# Patient Record
Sex: Male | Born: 1940
Health system: Southern US, Community
[De-identification: ages and names within clinical notes are randomized; demographics above are authoritative.]

## PROBLEM LIST (undated history)

## (undated) DIAGNOSIS — G8929 Other chronic pain: Secondary | ICD-10-CM

## (undated) DIAGNOSIS — I10 Essential (primary) hypertension: Secondary | ICD-10-CM

## (undated) DIAGNOSIS — F32A Depression, unspecified: Secondary | ICD-10-CM

## (undated) HISTORY — PX: ABDOMINAL SURGERY: SHX537

---

## 2011-05-18 DIAGNOSIS — M545 Low back pain: Secondary | ICD-10-CM | POA: Diagnosis not present

## 2011-10-15 DIAGNOSIS — M1710 Unilateral primary osteoarthritis, unspecified knee: Secondary | ICD-10-CM | POA: Diagnosis not present

## 2011-11-20 DIAGNOSIS — Z79899 Other long term (current) drug therapy: Secondary | ICD-10-CM | POA: Diagnosis not present

## 2011-11-20 DIAGNOSIS — Z23 Encounter for immunization: Secondary | ICD-10-CM | POA: Diagnosis not present

## 2012-05-20 DIAGNOSIS — M1310 Monoarthritis, not elsewhere classified, unspecified site: Secondary | ICD-10-CM | POA: Diagnosis not present

## 2012-05-20 DIAGNOSIS — R5383 Other fatigue: Secondary | ICD-10-CM | POA: Diagnosis not present

## 2012-05-20 DIAGNOSIS — R609 Edema, unspecified: Secondary | ICD-10-CM | POA: Diagnosis not present

## 2012-05-20 DIAGNOSIS — Z6829 Body mass index (BMI) 29.0-29.9, adult: Secondary | ICD-10-CM | POA: Diagnosis not present

## 2012-05-20 DIAGNOSIS — R509 Fever, unspecified: Secondary | ICD-10-CM | POA: Diagnosis not present

## 2012-05-20 DIAGNOSIS — R5381 Other malaise: Secondary | ICD-10-CM | POA: Diagnosis not present

## 2012-05-20 DIAGNOSIS — M79609 Pain in unspecified limb: Secondary | ICD-10-CM | POA: Diagnosis not present

## 2012-05-21 DIAGNOSIS — Z8589 Personal history of malignant neoplasm of other organs and systems: Secondary | ICD-10-CM | POA: Diagnosis not present

## 2012-05-21 DIAGNOSIS — M79609 Pain in unspecified limb: Secondary | ICD-10-CM | POA: Diagnosis not present

## 2012-05-21 DIAGNOSIS — R599 Enlarged lymph nodes, unspecified: Secondary | ICD-10-CM | POA: Diagnosis not present

## 2012-05-21 DIAGNOSIS — Z85828 Personal history of other malignant neoplasm of skin: Secondary | ICD-10-CM | POA: Diagnosis not present

## 2012-05-21 DIAGNOSIS — R609 Edema, unspecified: Secondary | ICD-10-CM | POA: Diagnosis not present

## 2012-05-21 DIAGNOSIS — M7989 Other specified soft tissue disorders: Secondary | ICD-10-CM | POA: Diagnosis not present

## 2012-06-22 DIAGNOSIS — R197 Diarrhea, unspecified: Secondary | ICD-10-CM | POA: Diagnosis not present

## 2012-06-22 DIAGNOSIS — Z5181 Encounter for therapeutic drug level monitoring: Secondary | ICD-10-CM | POA: Diagnosis not present

## 2012-06-23 DIAGNOSIS — R197 Diarrhea, unspecified: Secondary | ICD-10-CM | POA: Diagnosis not present

## 2012-06-27 DIAGNOSIS — F17200 Nicotine dependence, unspecified, uncomplicated: Secondary | ICD-10-CM | POA: Diagnosis not present

## 2012-06-27 DIAGNOSIS — K9413 Enterostomy malfunction: Secondary | ICD-10-CM | POA: Diagnosis not present

## 2012-06-27 DIAGNOSIS — K5732 Diverticulitis of large intestine without perforation or abscess without bleeding: Secondary | ICD-10-CM | POA: Diagnosis not present

## 2012-06-27 DIAGNOSIS — K9403 Colostomy malfunction: Secondary | ICD-10-CM | POA: Diagnosis not present

## 2012-07-22 DIAGNOSIS — Z85038 Personal history of other malignant neoplasm of large intestine: Secondary | ICD-10-CM | POA: Diagnosis not present

## 2012-07-26 DIAGNOSIS — Q619 Cystic kidney disease, unspecified: Secondary | ICD-10-CM | POA: Diagnosis not present

## 2012-07-26 DIAGNOSIS — Q762 Congenital spondylolisthesis: Secondary | ICD-10-CM | POA: Diagnosis not present

## 2012-07-26 DIAGNOSIS — M5137 Other intervertebral disc degeneration, lumbosacral region: Secondary | ICD-10-CM | POA: Diagnosis not present

## 2012-07-26 DIAGNOSIS — C187 Malignant neoplasm of sigmoid colon: Secondary | ICD-10-CM | POA: Diagnosis not present

## 2012-08-29 DIAGNOSIS — F17200 Nicotine dependence, unspecified, uncomplicated: Secondary | ICD-10-CM | POA: Diagnosis not present

## 2012-08-29 DIAGNOSIS — Z933 Colostomy status: Secondary | ICD-10-CM | POA: Diagnosis not present

## 2012-08-29 DIAGNOSIS — Z1211 Encounter for screening for malignant neoplasm of colon: Secondary | ICD-10-CM | POA: Diagnosis not present

## 2012-08-29 DIAGNOSIS — Z9049 Acquired absence of other specified parts of digestive tract: Secondary | ICD-10-CM | POA: Diagnosis not present

## 2012-08-29 DIAGNOSIS — Z85038 Personal history of other malignant neoplasm of large intestine: Secondary | ICD-10-CM | POA: Diagnosis not present

## 2012-08-30 DIAGNOSIS — K9413 Enterostomy malfunction: Secondary | ICD-10-CM | POA: Diagnosis not present

## 2012-08-30 DIAGNOSIS — F17200 Nicotine dependence, unspecified, uncomplicated: Secondary | ICD-10-CM | POA: Diagnosis not present

## 2012-08-30 DIAGNOSIS — K5732 Diverticulitis of large intestine without perforation or abscess without bleeding: Secondary | ICD-10-CM | POA: Diagnosis not present

## 2012-08-30 DIAGNOSIS — K9403 Colostomy malfunction: Secondary | ICD-10-CM | POA: Diagnosis not present

## 2012-09-05 DIAGNOSIS — Z0389 Encounter for observation for other suspected diseases and conditions ruled out: Secondary | ICD-10-CM | POA: Diagnosis not present

## 2012-09-12 DIAGNOSIS — K380 Hyperplasia of appendix: Secondary | ICD-10-CM | POA: Diagnosis not present

## 2012-09-12 DIAGNOSIS — R110 Nausea: Secondary | ICD-10-CM | POA: Diagnosis not present

## 2012-09-12 DIAGNOSIS — K9413 Enterostomy malfunction: Secondary | ICD-10-CM | POA: Diagnosis present

## 2012-09-12 DIAGNOSIS — K5730 Diverticulosis of large intestine without perforation or abscess without bleeding: Secondary | ICD-10-CM | POA: Diagnosis present

## 2012-09-12 DIAGNOSIS — K660 Peritoneal adhesions (postprocedural) (postinfection): Secondary | ICD-10-CM | POA: Diagnosis not present

## 2012-09-12 DIAGNOSIS — F17200 Nicotine dependence, unspecified, uncomplicated: Secondary | ICD-10-CM | POA: Diagnosis not present

## 2012-09-12 DIAGNOSIS — Z433 Encounter for attention to colostomy: Secondary | ICD-10-CM | POA: Diagnosis not present

## 2012-09-12 DIAGNOSIS — K639 Disease of intestine, unspecified: Secondary | ICD-10-CM | POA: Diagnosis present

## 2012-09-12 DIAGNOSIS — K9403 Colostomy malfunction: Secondary | ICD-10-CM | POA: Diagnosis not present

## 2012-09-12 DIAGNOSIS — K630 Abscess of intestine: Secondary | ICD-10-CM | POA: Diagnosis not present

## 2012-09-12 DIAGNOSIS — K37 Unspecified appendicitis: Secondary | ICD-10-CM | POA: Diagnosis not present

## 2012-09-12 DIAGNOSIS — Z85038 Personal history of other malignant neoplasm of large intestine: Secondary | ICD-10-CM | POA: Diagnosis not present

## 2012-11-25 DIAGNOSIS — Z23 Encounter for immunization: Secondary | ICD-10-CM | POA: Diagnosis not present

## 2012-11-25 DIAGNOSIS — Z5181 Encounter for therapeutic drug level monitoring: Secondary | ICD-10-CM | POA: Diagnosis not present

## 2012-12-23 DIAGNOSIS — M21169 Varus deformity, not elsewhere classified, unspecified knee: Secondary | ICD-10-CM | POA: Diagnosis not present

## 2012-12-23 DIAGNOSIS — M1710 Unilateral primary osteoarthritis, unspecified knee: Secondary | ICD-10-CM | POA: Diagnosis not present

## 2012-12-23 DIAGNOSIS — R269 Unspecified abnormalities of gait and mobility: Secondary | ICD-10-CM | POA: Diagnosis not present

## 2012-12-23 DIAGNOSIS — M25569 Pain in unspecified knee: Secondary | ICD-10-CM | POA: Diagnosis not present

## 2013-01-20 DIAGNOSIS — M1710 Unilateral primary osteoarthritis, unspecified knee: Secondary | ICD-10-CM | POA: Diagnosis not present

## 2013-01-23 DIAGNOSIS — M25569 Pain in unspecified knee: Secondary | ICD-10-CM | POA: Diagnosis not present

## 2013-01-27 DIAGNOSIS — M1710 Unilateral primary osteoarthritis, unspecified knee: Secondary | ICD-10-CM | POA: Diagnosis not present

## 2013-02-03 DIAGNOSIS — M1710 Unilateral primary osteoarthritis, unspecified knee: Secondary | ICD-10-CM | POA: Diagnosis not present

## 2013-02-14 DIAGNOSIS — J019 Acute sinusitis, unspecified: Secondary | ICD-10-CM | POA: Diagnosis not present

## 2013-02-20 DIAGNOSIS — M7989 Other specified soft tissue disorders: Secondary | ICD-10-CM | POA: Diagnosis not present

## 2013-02-20 DIAGNOSIS — M25529 Pain in unspecified elbow: Secondary | ICD-10-CM | POA: Diagnosis not present

## 2013-02-20 DIAGNOSIS — M948X9 Other specified disorders of cartilage, unspecified sites: Secondary | ICD-10-CM | POA: Diagnosis not present

## 2013-02-20 DIAGNOSIS — S3981XA Other specified injuries of abdomen, initial encounter: Secondary | ICD-10-CM | POA: Diagnosis not present

## 2013-02-20 DIAGNOSIS — S52599A Other fractures of lower end of unspecified radius, initial encounter for closed fracture: Secondary | ICD-10-CM | POA: Diagnosis not present

## 2013-02-20 DIAGNOSIS — M109 Gout, unspecified: Secondary | ICD-10-CM | POA: Diagnosis not present

## 2013-02-20 DIAGNOSIS — F17200 Nicotine dependence, unspecified, uncomplicated: Secondary | ICD-10-CM | POA: Diagnosis not present

## 2013-02-20 DIAGNOSIS — M1120 Other chondrocalcinosis, unspecified site: Secondary | ICD-10-CM | POA: Diagnosis not present

## 2013-02-20 DIAGNOSIS — S6990XA Unspecified injury of unspecified wrist, hand and finger(s), initial encounter: Secondary | ICD-10-CM | POA: Diagnosis not present

## 2013-02-20 DIAGNOSIS — S5290XA Unspecified fracture of unspecified forearm, initial encounter for closed fracture: Secondary | ICD-10-CM | POA: Diagnosis not present

## 2013-02-20 DIAGNOSIS — M129 Arthropathy, unspecified: Secondary | ICD-10-CM | POA: Diagnosis not present

## 2013-02-20 DIAGNOSIS — S59909A Unspecified injury of unspecified elbow, initial encounter: Secondary | ICD-10-CM | POA: Diagnosis not present

## 2013-02-20 DIAGNOSIS — R109 Unspecified abdominal pain: Secondary | ICD-10-CM | POA: Diagnosis not present

## 2013-02-20 DIAGNOSIS — M25539 Pain in unspecified wrist: Secondary | ICD-10-CM | POA: Diagnosis not present

## 2013-02-20 DIAGNOSIS — S8000XA Contusion of unspecified knee, initial encounter: Secondary | ICD-10-CM | POA: Diagnosis not present

## 2013-02-23 DIAGNOSIS — S52599A Other fractures of lower end of unspecified radius, initial encounter for closed fracture: Secondary | ICD-10-CM | POA: Diagnosis not present

## 2013-03-02 DIAGNOSIS — S52599A Other fractures of lower end of unspecified radius, initial encounter for closed fracture: Secondary | ICD-10-CM | POA: Diagnosis not present

## 2013-03-17 DIAGNOSIS — M1710 Unilateral primary osteoarthritis, unspecified knee: Secondary | ICD-10-CM | POA: Diagnosis not present

## 2013-03-17 DIAGNOSIS — M25569 Pain in unspecified knee: Secondary | ICD-10-CM | POA: Diagnosis not present

## 2013-03-23 DIAGNOSIS — S52599A Other fractures of lower end of unspecified radius, initial encounter for closed fracture: Secondary | ICD-10-CM | POA: Diagnosis not present

## 2013-03-24 DIAGNOSIS — M1710 Unilateral primary osteoarthritis, unspecified knee: Secondary | ICD-10-CM | POA: Diagnosis not present

## 2013-03-31 DIAGNOSIS — M1710 Unilateral primary osteoarthritis, unspecified knee: Secondary | ICD-10-CM | POA: Diagnosis not present

## 2013-04-07 DIAGNOSIS — M1710 Unilateral primary osteoarthritis, unspecified knee: Secondary | ICD-10-CM | POA: Diagnosis not present

## 2013-04-10 DIAGNOSIS — M1710 Unilateral primary osteoarthritis, unspecified knee: Secondary | ICD-10-CM | POA: Diagnosis not present

## 2013-04-10 DIAGNOSIS — R269 Unspecified abnormalities of gait and mobility: Secondary | ICD-10-CM | POA: Diagnosis not present

## 2013-04-12 DIAGNOSIS — R269 Unspecified abnormalities of gait and mobility: Secondary | ICD-10-CM | POA: Diagnosis not present

## 2013-04-12 DIAGNOSIS — M1710 Unilateral primary osteoarthritis, unspecified knee: Secondary | ICD-10-CM | POA: Diagnosis not present

## 2013-04-14 DIAGNOSIS — R269 Unspecified abnormalities of gait and mobility: Secondary | ICD-10-CM | POA: Diagnosis not present

## 2013-04-14 DIAGNOSIS — M1710 Unilateral primary osteoarthritis, unspecified knee: Secondary | ICD-10-CM | POA: Diagnosis not present

## 2013-04-18 DIAGNOSIS — R269 Unspecified abnormalities of gait and mobility: Secondary | ICD-10-CM | POA: Diagnosis not present

## 2013-04-18 DIAGNOSIS — M1710 Unilateral primary osteoarthritis, unspecified knee: Secondary | ICD-10-CM | POA: Diagnosis not present

## 2013-04-19 DIAGNOSIS — R269 Unspecified abnormalities of gait and mobility: Secondary | ICD-10-CM | POA: Diagnosis not present

## 2013-04-19 DIAGNOSIS — M1710 Unilateral primary osteoarthritis, unspecified knee: Secondary | ICD-10-CM | POA: Diagnosis not present

## 2013-04-20 DIAGNOSIS — S52599A Other fractures of lower end of unspecified radius, initial encounter for closed fracture: Secondary | ICD-10-CM | POA: Diagnosis not present

## 2013-04-21 DIAGNOSIS — M1710 Unilateral primary osteoarthritis, unspecified knee: Secondary | ICD-10-CM | POA: Diagnosis not present

## 2013-04-21 DIAGNOSIS — R269 Unspecified abnormalities of gait and mobility: Secondary | ICD-10-CM | POA: Diagnosis not present

## 2013-04-24 DIAGNOSIS — R269 Unspecified abnormalities of gait and mobility: Secondary | ICD-10-CM | POA: Diagnosis not present

## 2013-04-24 DIAGNOSIS — M1710 Unilateral primary osteoarthritis, unspecified knee: Secondary | ICD-10-CM | POA: Diagnosis not present

## 2013-04-25 DIAGNOSIS — F3289 Other specified depressive episodes: Secondary | ICD-10-CM | POA: Diagnosis not present

## 2013-04-25 DIAGNOSIS — F329 Major depressive disorder, single episode, unspecified: Secondary | ICD-10-CM | POA: Diagnosis not present

## 2013-04-26 DIAGNOSIS — R269 Unspecified abnormalities of gait and mobility: Secondary | ICD-10-CM | POA: Diagnosis not present

## 2013-04-26 DIAGNOSIS — M1710 Unilateral primary osteoarthritis, unspecified knee: Secondary | ICD-10-CM | POA: Diagnosis not present

## 2013-04-28 DIAGNOSIS — M1710 Unilateral primary osteoarthritis, unspecified knee: Secondary | ICD-10-CM | POA: Diagnosis not present

## 2013-04-28 DIAGNOSIS — R269 Unspecified abnormalities of gait and mobility: Secondary | ICD-10-CM | POA: Diagnosis not present

## 2013-05-02 DIAGNOSIS — R269 Unspecified abnormalities of gait and mobility: Secondary | ICD-10-CM | POA: Diagnosis not present

## 2013-05-02 DIAGNOSIS — M1710 Unilateral primary osteoarthritis, unspecified knee: Secondary | ICD-10-CM | POA: Diagnosis not present

## 2013-05-03 DIAGNOSIS — M1710 Unilateral primary osteoarthritis, unspecified knee: Secondary | ICD-10-CM | POA: Diagnosis not present

## 2013-05-03 DIAGNOSIS — R269 Unspecified abnormalities of gait and mobility: Secondary | ICD-10-CM | POA: Diagnosis not present

## 2013-05-04 DIAGNOSIS — S52599A Other fractures of lower end of unspecified radius, initial encounter for closed fracture: Secondary | ICD-10-CM | POA: Diagnosis not present

## 2013-05-05 DIAGNOSIS — R269 Unspecified abnormalities of gait and mobility: Secondary | ICD-10-CM | POA: Diagnosis not present

## 2013-05-05 DIAGNOSIS — M1710 Unilateral primary osteoarthritis, unspecified knee: Secondary | ICD-10-CM | POA: Diagnosis not present

## 2013-05-10 DIAGNOSIS — R269 Unspecified abnormalities of gait and mobility: Secondary | ICD-10-CM | POA: Diagnosis not present

## 2013-05-10 DIAGNOSIS — M1710 Unilateral primary osteoarthritis, unspecified knee: Secondary | ICD-10-CM | POA: Diagnosis not present

## 2013-05-17 DIAGNOSIS — R269 Unspecified abnormalities of gait and mobility: Secondary | ICD-10-CM | POA: Diagnosis not present

## 2013-05-17 DIAGNOSIS — M1710 Unilateral primary osteoarthritis, unspecified knee: Secondary | ICD-10-CM | POA: Diagnosis not present

## 2013-05-23 DIAGNOSIS — Z79899 Other long term (current) drug therapy: Secondary | ICD-10-CM | POA: Diagnosis not present

## 2013-05-23 DIAGNOSIS — F329 Major depressive disorder, single episode, unspecified: Secondary | ICD-10-CM | POA: Diagnosis not present

## 2013-05-23 DIAGNOSIS — F3289 Other specified depressive episodes: Secondary | ICD-10-CM | POA: Diagnosis not present

## 2013-05-24 DIAGNOSIS — M1710 Unilateral primary osteoarthritis, unspecified knee: Secondary | ICD-10-CM | POA: Diagnosis not present

## 2013-05-24 DIAGNOSIS — R269 Unspecified abnormalities of gait and mobility: Secondary | ICD-10-CM | POA: Diagnosis not present

## 2013-05-26 DIAGNOSIS — M1710 Unilateral primary osteoarthritis, unspecified knee: Secondary | ICD-10-CM | POA: Diagnosis not present

## 2013-05-26 DIAGNOSIS — R269 Unspecified abnormalities of gait and mobility: Secondary | ICD-10-CM | POA: Diagnosis not present

## 2013-05-30 DIAGNOSIS — M1710 Unilateral primary osteoarthritis, unspecified knee: Secondary | ICD-10-CM | POA: Diagnosis not present

## 2013-05-30 DIAGNOSIS — R269 Unspecified abnormalities of gait and mobility: Secondary | ICD-10-CM | POA: Diagnosis not present

## 2013-05-31 DIAGNOSIS — M1710 Unilateral primary osteoarthritis, unspecified knee: Secondary | ICD-10-CM | POA: Diagnosis not present

## 2013-05-31 DIAGNOSIS — R269 Unspecified abnormalities of gait and mobility: Secondary | ICD-10-CM | POA: Diagnosis not present

## 2013-06-01 DIAGNOSIS — S5290XA Unspecified fracture of unspecified forearm, initial encounter for closed fracture: Secondary | ICD-10-CM | POA: Diagnosis not present

## 2013-06-01 DIAGNOSIS — S52599A Other fractures of lower end of unspecified radius, initial encounter for closed fracture: Secondary | ICD-10-CM | POA: Diagnosis not present

## 2013-06-07 DIAGNOSIS — M1710 Unilateral primary osteoarthritis, unspecified knee: Secondary | ICD-10-CM | POA: Diagnosis not present

## 2013-06-07 DIAGNOSIS — R269 Unspecified abnormalities of gait and mobility: Secondary | ICD-10-CM | POA: Diagnosis not present

## 2013-06-08 DIAGNOSIS — R269 Unspecified abnormalities of gait and mobility: Secondary | ICD-10-CM | POA: Diagnosis not present

## 2013-06-08 DIAGNOSIS — M1710 Unilateral primary osteoarthritis, unspecified knee: Secondary | ICD-10-CM | POA: Diagnosis not present

## 2013-10-02 DIAGNOSIS — H2510 Age-related nuclear cataract, unspecified eye: Secondary | ICD-10-CM | POA: Diagnosis not present

## 2013-11-14 DIAGNOSIS — H179 Unspecified corneal scar and opacity: Secondary | ICD-10-CM | POA: Diagnosis not present

## 2013-11-14 DIAGNOSIS — H2510 Age-related nuclear cataract, unspecified eye: Secondary | ICD-10-CM | POA: Diagnosis not present

## 2013-11-22 DIAGNOSIS — M1990 Unspecified osteoarthritis, unspecified site: Secondary | ICD-10-CM | POA: Diagnosis not present

## 2013-11-22 DIAGNOSIS — F329 Major depressive disorder, single episode, unspecified: Secondary | ICD-10-CM | POA: Diagnosis not present

## 2013-11-22 DIAGNOSIS — Z9181 History of falling: Secondary | ICD-10-CM | POA: Diagnosis not present

## 2013-11-22 DIAGNOSIS — Z79899 Other long term (current) drug therapy: Secondary | ICD-10-CM | POA: Diagnosis not present

## 2013-11-22 DIAGNOSIS — Z23 Encounter for immunization: Secondary | ICD-10-CM | POA: Diagnosis not present

## 2013-12-18 DIAGNOSIS — E781 Pure hyperglyceridemia: Secondary | ICD-10-CM | POA: Diagnosis not present

## 2013-12-18 DIAGNOSIS — R351 Nocturia: Secondary | ICD-10-CM | POA: Diagnosis not present

## 2013-12-18 DIAGNOSIS — F5221 Male erectile disorder: Secondary | ICD-10-CM | POA: Diagnosis not present

## 2013-12-18 DIAGNOSIS — M179 Osteoarthritis of knee, unspecified: Secondary | ICD-10-CM | POA: Diagnosis not present

## 2013-12-20 DIAGNOSIS — M1711 Unilateral primary osteoarthritis, right knee: Secondary | ICD-10-CM | POA: Diagnosis not present

## 2013-12-20 DIAGNOSIS — M25561 Pain in right knee: Secondary | ICD-10-CM | POA: Diagnosis not present

## 2013-12-25 DIAGNOSIS — H2511 Age-related nuclear cataract, right eye: Secondary | ICD-10-CM | POA: Diagnosis not present

## 2013-12-25 DIAGNOSIS — I10 Essential (primary) hypertension: Secondary | ICD-10-CM | POA: Diagnosis not present

## 2013-12-25 DIAGNOSIS — H2513 Age-related nuclear cataract, bilateral: Secondary | ICD-10-CM | POA: Diagnosis not present

## 2013-12-27 DIAGNOSIS — M1711 Unilateral primary osteoarthritis, right knee: Secondary | ICD-10-CM | POA: Diagnosis not present

## 2013-12-29 DIAGNOSIS — M1990 Unspecified osteoarthritis, unspecified site: Secondary | ICD-10-CM | POA: Diagnosis not present

## 2013-12-29 DIAGNOSIS — Z01818 Encounter for other preprocedural examination: Secondary | ICD-10-CM | POA: Diagnosis not present

## 2013-12-29 DIAGNOSIS — I10 Essential (primary) hypertension: Secondary | ICD-10-CM | POA: Diagnosis not present

## 2014-01-02 DIAGNOSIS — H2513 Age-related nuclear cataract, bilateral: Secondary | ICD-10-CM | POA: Diagnosis not present

## 2014-01-08 DIAGNOSIS — H2512 Age-related nuclear cataract, left eye: Secondary | ICD-10-CM | POA: Diagnosis not present

## 2014-01-08 DIAGNOSIS — M1990 Unspecified osteoarthritis, unspecified site: Secondary | ICD-10-CM | POA: Diagnosis not present

## 2014-01-08 DIAGNOSIS — Z9049 Acquired absence of other specified parts of digestive tract: Secondary | ICD-10-CM | POA: Diagnosis not present

## 2014-01-08 DIAGNOSIS — H2513 Age-related nuclear cataract, bilateral: Secondary | ICD-10-CM | POA: Diagnosis not present

## 2014-01-08 DIAGNOSIS — M109 Gout, unspecified: Secondary | ICD-10-CM | POA: Diagnosis not present

## 2014-01-08 DIAGNOSIS — I10 Essential (primary) hypertension: Secondary | ICD-10-CM | POA: Diagnosis not present

## 2014-01-08 DIAGNOSIS — Z79899 Other long term (current) drug therapy: Secondary | ICD-10-CM | POA: Diagnosis not present

## 2014-01-09 DIAGNOSIS — I10 Essential (primary) hypertension: Secondary | ICD-10-CM | POA: Diagnosis not present

## 2014-01-09 DIAGNOSIS — Z01810 Encounter for preprocedural cardiovascular examination: Secondary | ICD-10-CM | POA: Diagnosis not present

## 2014-01-09 DIAGNOSIS — I252 Old myocardial infarction: Secondary | ICD-10-CM | POA: Diagnosis not present

## 2014-01-19 DIAGNOSIS — H2513 Age-related nuclear cataract, bilateral: Secondary | ICD-10-CM | POA: Diagnosis not present

## 2014-02-08 DIAGNOSIS — R109 Unspecified abdominal pain: Secondary | ICD-10-CM | POA: Diagnosis not present

## 2014-02-08 DIAGNOSIS — I10 Essential (primary) hypertension: Secondary | ICD-10-CM | POA: Diagnosis not present

## 2014-02-08 DIAGNOSIS — F329 Major depressive disorder, single episode, unspecified: Secondary | ICD-10-CM | POA: Diagnosis not present

## 2014-02-22 DIAGNOSIS — R109 Unspecified abdominal pain: Secondary | ICD-10-CM | POA: Diagnosis not present

## 2014-02-22 DIAGNOSIS — M1990 Unspecified osteoarthritis, unspecified site: Secondary | ICD-10-CM | POA: Diagnosis not present

## 2014-02-22 DIAGNOSIS — K439 Ventral hernia without obstruction or gangrene: Secondary | ICD-10-CM | POA: Diagnosis not present

## 2014-02-22 DIAGNOSIS — I10 Essential (primary) hypertension: Secondary | ICD-10-CM | POA: Diagnosis not present

## 2014-03-07 DIAGNOSIS — Z9911 Dependence on respirator [ventilator] status: Secondary | ICD-10-CM | POA: Diagnosis not present

## 2014-03-07 DIAGNOSIS — K430 Incisional hernia with obstruction, without gangrene: Secondary | ICD-10-CM | POA: Diagnosis not present

## 2014-03-07 DIAGNOSIS — J9811 Atelectasis: Secondary | ICD-10-CM | POA: Diagnosis not present

## 2014-03-07 DIAGNOSIS — R109 Unspecified abdominal pain: Secondary | ICD-10-CM | POA: Diagnosis not present

## 2014-03-07 DIAGNOSIS — E119 Type 2 diabetes mellitus without complications: Secondary | ICD-10-CM | POA: Diagnosis present

## 2014-03-07 DIAGNOSIS — K660 Peritoneal adhesions (postprocedural) (postinfection): Secondary | ICD-10-CM | POA: Diagnosis not present

## 2014-03-07 DIAGNOSIS — K913 Postprocedural intestinal obstruction: Secondary | ICD-10-CM | POA: Diagnosis present

## 2014-03-07 DIAGNOSIS — I471 Supraventricular tachycardia: Secondary | ICD-10-CM | POA: Diagnosis not present

## 2014-03-07 DIAGNOSIS — D649 Anemia, unspecified: Secondary | ICD-10-CM | POA: Diagnosis present

## 2014-03-07 DIAGNOSIS — R918 Other nonspecific abnormal finding of lung field: Secondary | ICD-10-CM | POA: Diagnosis not present

## 2014-03-07 DIAGNOSIS — Z85038 Personal history of other malignant neoplasm of large intestine: Secondary | ICD-10-CM | POA: Diagnosis not present

## 2014-03-07 DIAGNOSIS — F17210 Nicotine dependence, cigarettes, uncomplicated: Secondary | ICD-10-CM | POA: Diagnosis present

## 2014-03-07 DIAGNOSIS — Z716 Tobacco abuse counseling: Secondary | ICD-10-CM | POA: Diagnosis present

## 2014-03-07 DIAGNOSIS — I10 Essential (primary) hypertension: Secondary | ICD-10-CM | POA: Diagnosis present

## 2014-03-07 DIAGNOSIS — R000 Tachycardia, unspecified: Secondary | ICD-10-CM | POA: Diagnosis present

## 2014-03-07 DIAGNOSIS — Z9089 Acquired absence of other organs: Secondary | ICD-10-CM | POA: Diagnosis present

## 2014-03-07 DIAGNOSIS — L905 Scar conditions and fibrosis of skin: Secondary | ICD-10-CM | POA: Diagnosis not present

## 2014-03-07 DIAGNOSIS — M1990 Unspecified osteoarthritis, unspecified site: Secondary | ICD-10-CM | POA: Diagnosis present

## 2014-03-07 DIAGNOSIS — R0600 Dyspnea, unspecified: Secondary | ICD-10-CM | POA: Diagnosis not present

## 2014-03-07 DIAGNOSIS — J180 Bronchopneumonia, unspecified organism: Secondary | ICD-10-CM | POA: Diagnosis present

## 2014-03-07 DIAGNOSIS — K219 Gastro-esophageal reflux disease without esophagitis: Secondary | ICD-10-CM | POA: Diagnosis present

## 2014-03-07 DIAGNOSIS — R0902 Hypoxemia: Secondary | ICD-10-CM | POA: Diagnosis not present

## 2014-03-07 DIAGNOSIS — J9690 Respiratory failure, unspecified, unspecified whether with hypoxia or hypercapnia: Secondary | ICD-10-CM | POA: Diagnosis not present

## 2014-03-19 DIAGNOSIS — R0902 Hypoxemia: Secondary | ICD-10-CM | POA: Diagnosis not present

## 2014-03-19 DIAGNOSIS — Z6833 Body mass index (BMI) 33.0-33.9, adult: Secondary | ICD-10-CM | POA: Diagnosis not present

## 2014-04-23 DIAGNOSIS — M1990 Unspecified osteoarthritis, unspecified site: Secondary | ICD-10-CM | POA: Diagnosis not present

## 2014-05-14 DIAGNOSIS — L723 Sebaceous cyst: Secondary | ICD-10-CM | POA: Diagnosis not present

## 2014-05-14 DIAGNOSIS — Z0000 Encounter for general adult medical examination without abnormal findings: Secondary | ICD-10-CM | POA: Diagnosis not present

## 2014-05-14 DIAGNOSIS — Z1389 Encounter for screening for other disorder: Secondary | ICD-10-CM | POA: Diagnosis not present

## 2014-05-21 DIAGNOSIS — Z9181 History of falling: Secondary | ICD-10-CM | POA: Diagnosis not present

## 2014-05-21 DIAGNOSIS — M1990 Unspecified osteoarthritis, unspecified site: Secondary | ICD-10-CM | POA: Diagnosis not present

## 2014-05-21 DIAGNOSIS — Z79899 Other long term (current) drug therapy: Secondary | ICD-10-CM | POA: Diagnosis not present

## 2014-05-21 DIAGNOSIS — Z1389 Encounter for screening for other disorder: Secondary | ICD-10-CM | POA: Diagnosis not present

## 2014-05-21 DIAGNOSIS — I10 Essential (primary) hypertension: Secondary | ICD-10-CM | POA: Diagnosis not present

## 2014-05-21 DIAGNOSIS — Z6832 Body mass index (BMI) 32.0-32.9, adult: Secondary | ICD-10-CM | POA: Diagnosis not present

## 2014-05-21 DIAGNOSIS — Z23 Encounter for immunization: Secondary | ICD-10-CM | POA: Diagnosis not present

## 2014-05-26 DIAGNOSIS — L0291 Cutaneous abscess, unspecified: Secondary | ICD-10-CM | POA: Diagnosis not present

## 2014-05-26 DIAGNOSIS — F17210 Nicotine dependence, cigarettes, uncomplicated: Secondary | ICD-10-CM | POA: Diagnosis not present

## 2014-05-26 DIAGNOSIS — I10 Essential (primary) hypertension: Secondary | ICD-10-CM | POA: Diagnosis not present

## 2014-05-26 DIAGNOSIS — M109 Gout, unspecified: Secondary | ICD-10-CM | POA: Diagnosis not present

## 2014-05-26 DIAGNOSIS — L02414 Cutaneous abscess of left upper limb: Secondary | ICD-10-CM | POA: Diagnosis not present

## 2014-05-28 DIAGNOSIS — F17210 Nicotine dependence, cigarettes, uncomplicated: Secondary | ICD-10-CM | POA: Diagnosis not present

## 2014-05-28 DIAGNOSIS — L02414 Cutaneous abscess of left upper limb: Secondary | ICD-10-CM | POA: Diagnosis not present

## 2014-05-28 DIAGNOSIS — Z4800 Encounter for change or removal of nonsurgical wound dressing: Secondary | ICD-10-CM | POA: Diagnosis not present

## 2014-05-28 DIAGNOSIS — I10 Essential (primary) hypertension: Secondary | ICD-10-CM | POA: Diagnosis not present

## 2014-07-06 DIAGNOSIS — Z1389 Encounter for screening for other disorder: Secondary | ICD-10-CM | POA: Diagnosis not present

## 2014-07-06 DIAGNOSIS — Z6833 Body mass index (BMI) 33.0-33.9, adult: Secondary | ICD-10-CM | POA: Diagnosis not present

## 2014-07-06 DIAGNOSIS — Z9181 History of falling: Secondary | ICD-10-CM | POA: Diagnosis not present

## 2014-07-06 DIAGNOSIS — F329 Major depressive disorder, single episode, unspecified: Secondary | ICD-10-CM | POA: Diagnosis not present

## 2014-09-10 DIAGNOSIS — Z01818 Encounter for other preprocedural examination: Secondary | ICD-10-CM | POA: Diagnosis not present

## 2014-09-10 DIAGNOSIS — Z6833 Body mass index (BMI) 33.0-33.9, adult: Secondary | ICD-10-CM | POA: Diagnosis not present

## 2014-09-10 DIAGNOSIS — M1990 Unspecified osteoarthritis, unspecified site: Secondary | ICD-10-CM | POA: Diagnosis not present

## 2014-09-11 DIAGNOSIS — Z01818 Encounter for other preprocedural examination: Secondary | ICD-10-CM | POA: Diagnosis not present

## 2014-09-11 DIAGNOSIS — M79606 Pain in leg, unspecified: Secondary | ICD-10-CM | POA: Diagnosis not present

## 2014-09-11 DIAGNOSIS — Z79899 Other long term (current) drug therapy: Secondary | ICD-10-CM | POA: Diagnosis not present

## 2014-09-11 DIAGNOSIS — Z01810 Encounter for preprocedural cardiovascular examination: Secondary | ICD-10-CM | POA: Diagnosis not present

## 2014-09-11 DIAGNOSIS — M79609 Pain in unspecified limb: Secondary | ICD-10-CM | POA: Diagnosis not present

## 2014-10-04 DIAGNOSIS — R109 Unspecified abdominal pain: Secondary | ICD-10-CM | POA: Diagnosis not present

## 2014-10-04 DIAGNOSIS — Z6832 Body mass index (BMI) 32.0-32.9, adult: Secondary | ICD-10-CM | POA: Diagnosis not present

## 2014-10-04 DIAGNOSIS — K439 Ventral hernia without obstruction or gangrene: Secondary | ICD-10-CM | POA: Diagnosis not present

## 2014-10-04 DIAGNOSIS — M1990 Unspecified osteoarthritis, unspecified site: Secondary | ICD-10-CM | POA: Diagnosis not present

## 2014-10-08 DIAGNOSIS — K439 Ventral hernia without obstruction or gangrene: Secondary | ICD-10-CM | POA: Diagnosis not present

## 2014-10-08 DIAGNOSIS — R1031 Right lower quadrant pain: Secondary | ICD-10-CM | POA: Diagnosis not present

## 2014-10-08 DIAGNOSIS — K469 Unspecified abdominal hernia without obstruction or gangrene: Secondary | ICD-10-CM | POA: Diagnosis not present

## 2014-10-08 DIAGNOSIS — K6389 Other specified diseases of intestine: Secondary | ICD-10-CM | POA: Diagnosis not present

## 2014-10-11 DIAGNOSIS — K439 Ventral hernia without obstruction or gangrene: Secondary | ICD-10-CM | POA: Diagnosis not present

## 2014-10-11 DIAGNOSIS — R1031 Right lower quadrant pain: Secondary | ICD-10-CM | POA: Diagnosis not present

## 2014-10-11 DIAGNOSIS — R109 Unspecified abdominal pain: Secondary | ICD-10-CM | POA: Diagnosis not present

## 2014-10-26 DIAGNOSIS — Z6833 Body mass index (BMI) 33.0-33.9, adult: Secondary | ICD-10-CM | POA: Diagnosis not present

## 2014-10-26 DIAGNOSIS — B029 Zoster without complications: Secondary | ICD-10-CM | POA: Diagnosis not present

## 2014-10-26 DIAGNOSIS — R1031 Right lower quadrant pain: Secondary | ICD-10-CM | POA: Diagnosis not present

## 2014-11-13 ENCOUNTER — Other Ambulatory Visit: Payer: Self-pay

## 2014-11-13 DIAGNOSIS — K432 Incisional hernia without obstruction or gangrene: Secondary | ICD-10-CM | POA: Diagnosis not present

## 2014-11-19 ENCOUNTER — Ambulatory Visit
Admission: RE | Admit: 2014-11-19 | Discharge: 2014-11-19 | Disposition: A | Discharge status: Home / Self Care | Payer: Medicare Other | Source: Ambulatory Visit | Attending: General Surgery | Admitting: General Surgery

## 2014-11-19 DIAGNOSIS — K432 Incisional hernia without obstruction or gangrene: Secondary | ICD-10-CM | POA: Diagnosis not present

## 2014-11-19 MED ORDER — IOPAMIDOL (ISOVUE-300) INJECTION 61%
125.0000 mL | Freq: Once | INTRAVENOUS | Status: AC | PRN
  Administered 2014-11-19: 125 mL via INTRAVENOUS

## 2014-11-26 DIAGNOSIS — R7309 Other abnormal glucose: Secondary | ICD-10-CM | POA: Diagnosis not present

## 2014-11-29 DIAGNOSIS — K432 Incisional hernia without obstruction or gangrene: Secondary | ICD-10-CM | POA: Diagnosis not present

## 2014-12-31 DIAGNOSIS — H26493 Other secondary cataract, bilateral: Secondary | ICD-10-CM | POA: Diagnosis not present

## 2015-01-04 DIAGNOSIS — K469 Unspecified abdominal hernia without obstruction or gangrene: Secondary | ICD-10-CM | POA: Diagnosis not present

## 2015-01-29 DIAGNOSIS — I10 Essential (primary) hypertension: Secondary | ICD-10-CM | POA: Diagnosis not present

## 2015-01-29 DIAGNOSIS — Z79899 Other long term (current) drug therapy: Secondary | ICD-10-CM | POA: Diagnosis not present

## 2015-01-29 DIAGNOSIS — E669 Obesity, unspecified: Secondary | ICD-10-CM | POA: Diagnosis not present

## 2015-01-29 DIAGNOSIS — Z1389 Encounter for screening for other disorder: Secondary | ICD-10-CM | POA: Diagnosis not present

## 2015-01-29 DIAGNOSIS — Z6832 Body mass index (BMI) 32.0-32.9, adult: Secondary | ICD-10-CM | POA: Diagnosis not present

## 2015-01-29 DIAGNOSIS — Z23 Encounter for immunization: Secondary | ICD-10-CM | POA: Diagnosis not present

## 2015-01-29 DIAGNOSIS — F329 Major depressive disorder, single episode, unspecified: Secondary | ICD-10-CM | POA: Diagnosis not present

## 2015-01-29 DIAGNOSIS — M1990 Unspecified osteoarthritis, unspecified site: Secondary | ICD-10-CM | POA: Diagnosis not present

## 2015-01-29 DIAGNOSIS — Z01818 Encounter for other preprocedural examination: Secondary | ICD-10-CM | POA: Diagnosis not present

## 2015-01-29 DIAGNOSIS — Z125 Encounter for screening for malignant neoplasm of prostate: Secondary | ICD-10-CM | POA: Diagnosis not present

## 2015-02-19 DIAGNOSIS — K439 Ventral hernia without obstruction or gangrene: Secondary | ICD-10-CM | POA: Diagnosis not present

## 2015-04-09 DIAGNOSIS — Z79899 Other long term (current) drug therapy: Secondary | ICD-10-CM | POA: Diagnosis not present

## 2015-04-09 DIAGNOSIS — Z01810 Encounter for preprocedural cardiovascular examination: Secondary | ICD-10-CM | POA: Diagnosis not present

## 2015-04-09 DIAGNOSIS — Z9889 Other specified postprocedural states: Secondary | ICD-10-CM | POA: Diagnosis not present

## 2015-04-09 DIAGNOSIS — Z01818 Encounter for other preprocedural examination: Secondary | ICD-10-CM | POA: Diagnosis not present

## 2015-04-09 DIAGNOSIS — M79609 Pain in unspecified limb: Secondary | ICD-10-CM | POA: Diagnosis not present

## 2015-04-17 DIAGNOSIS — M25561 Pain in right knee: Secondary | ICD-10-CM | POA: Diagnosis not present

## 2015-04-17 DIAGNOSIS — M1711 Unilateral primary osteoarthritis, right knee: Secondary | ICD-10-CM | POA: Diagnosis not present

## 2015-04-23 DIAGNOSIS — Z720 Tobacco use: Secondary | ICD-10-CM | POA: Diagnosis not present

## 2015-04-23 DIAGNOSIS — Z8719 Personal history of other diseases of the digestive system: Secondary | ICD-10-CM | POA: Diagnosis not present

## 2015-04-23 DIAGNOSIS — Z7982 Long term (current) use of aspirin: Secondary | ICD-10-CM | POA: Diagnosis not present

## 2015-04-23 DIAGNOSIS — Z96659 Presence of unspecified artificial knee joint: Secondary | ICD-10-CM | POA: Diagnosis not present

## 2015-04-23 DIAGNOSIS — Z85038 Personal history of other malignant neoplasm of large intestine: Secondary | ICD-10-CM | POA: Diagnosis not present

## 2015-04-23 DIAGNOSIS — Z96651 Presence of right artificial knee joint: Secondary | ICD-10-CM | POA: Diagnosis not present

## 2015-04-23 DIAGNOSIS — F17210 Nicotine dependence, cigarettes, uncomplicated: Secondary | ICD-10-CM | POA: Diagnosis not present

## 2015-04-23 DIAGNOSIS — Z471 Aftercare following joint replacement surgery: Secondary | ICD-10-CM | POA: Diagnosis not present

## 2015-04-23 DIAGNOSIS — M1711 Unilateral primary osteoarthritis, right knee: Secondary | ICD-10-CM | POA: Diagnosis not present

## 2015-04-23 DIAGNOSIS — R269 Unspecified abnormalities of gait and mobility: Secondary | ICD-10-CM | POA: Diagnosis present

## 2015-04-23 DIAGNOSIS — G8918 Other acute postprocedural pain: Secondary | ICD-10-CM | POA: Diagnosis present

## 2015-04-23 DIAGNOSIS — E669 Obesity, unspecified: Secondary | ICD-10-CM | POA: Diagnosis not present

## 2015-04-23 DIAGNOSIS — Z79899 Other long term (current) drug therapy: Secondary | ICD-10-CM | POA: Diagnosis not present

## 2015-04-23 DIAGNOSIS — K219 Gastro-esophageal reflux disease without esophagitis: Secondary | ICD-10-CM | POA: Diagnosis not present

## 2015-04-23 DIAGNOSIS — I10 Essential (primary) hypertension: Secondary | ICD-10-CM | POA: Diagnosis not present

## 2015-04-23 DIAGNOSIS — Z888 Allergy status to other drugs, medicaments and biological substances status: Secondary | ICD-10-CM | POA: Diagnosis not present

## 2015-04-23 DIAGNOSIS — F329 Major depressive disorder, single episode, unspecified: Secondary | ICD-10-CM | POA: Diagnosis present

## 2015-04-26 DIAGNOSIS — M47816 Spondylosis without myelopathy or radiculopathy, lumbar region: Secondary | ICD-10-CM | POA: Diagnosis not present

## 2015-04-26 DIAGNOSIS — Z6831 Body mass index (BMI) 31.0-31.9, adult: Secondary | ICD-10-CM | POA: Diagnosis not present

## 2015-04-26 DIAGNOSIS — Z471 Aftercare following joint replacement surgery: Secondary | ICD-10-CM | POA: Diagnosis not present

## 2015-04-26 DIAGNOSIS — M1990 Unspecified osteoarthritis, unspecified site: Secondary | ICD-10-CM | POA: Diagnosis not present

## 2015-04-26 DIAGNOSIS — Z96651 Presence of right artificial knee joint: Secondary | ICD-10-CM | POA: Diagnosis not present

## 2015-04-28 DIAGNOSIS — Z96651 Presence of right artificial knee joint: Secondary | ICD-10-CM | POA: Diagnosis not present

## 2015-04-28 DIAGNOSIS — M1990 Unspecified osteoarthritis, unspecified site: Secondary | ICD-10-CM | POA: Diagnosis not present

## 2015-04-28 DIAGNOSIS — Z6831 Body mass index (BMI) 31.0-31.9, adult: Secondary | ICD-10-CM | POA: Diagnosis not present

## 2015-04-28 DIAGNOSIS — Z471 Aftercare following joint replacement surgery: Secondary | ICD-10-CM | POA: Diagnosis not present

## 2015-04-28 DIAGNOSIS — M47816 Spondylosis without myelopathy or radiculopathy, lumbar region: Secondary | ICD-10-CM | POA: Diagnosis not present

## 2015-04-29 DIAGNOSIS — M1990 Unspecified osteoarthritis, unspecified site: Secondary | ICD-10-CM | POA: Diagnosis not present

## 2015-04-29 DIAGNOSIS — M47816 Spondylosis without myelopathy or radiculopathy, lumbar region: Secondary | ICD-10-CM | POA: Diagnosis not present

## 2015-04-29 DIAGNOSIS — Z471 Aftercare following joint replacement surgery: Secondary | ICD-10-CM | POA: Diagnosis not present

## 2015-04-29 DIAGNOSIS — Z6831 Body mass index (BMI) 31.0-31.9, adult: Secondary | ICD-10-CM | POA: Diagnosis not present

## 2015-04-29 DIAGNOSIS — Z96651 Presence of right artificial knee joint: Secondary | ICD-10-CM | POA: Diagnosis not present

## 2015-04-30 DIAGNOSIS — M1990 Unspecified osteoarthritis, unspecified site: Secondary | ICD-10-CM | POA: Diagnosis not present

## 2015-04-30 DIAGNOSIS — Z6831 Body mass index (BMI) 31.0-31.9, adult: Secondary | ICD-10-CM | POA: Diagnosis not present

## 2015-04-30 DIAGNOSIS — M47816 Spondylosis without myelopathy or radiculopathy, lumbar region: Secondary | ICD-10-CM | POA: Diagnosis not present

## 2015-04-30 DIAGNOSIS — Z96651 Presence of right artificial knee joint: Secondary | ICD-10-CM | POA: Diagnosis not present

## 2015-04-30 DIAGNOSIS — Z471 Aftercare following joint replacement surgery: Secondary | ICD-10-CM | POA: Diagnosis not present

## 2015-05-01 DIAGNOSIS — Z96651 Presence of right artificial knee joint: Secondary | ICD-10-CM | POA: Diagnosis not present

## 2015-05-01 DIAGNOSIS — Z6831 Body mass index (BMI) 31.0-31.9, adult: Secondary | ICD-10-CM | POA: Diagnosis not present

## 2015-05-01 DIAGNOSIS — Z471 Aftercare following joint replacement surgery: Secondary | ICD-10-CM | POA: Diagnosis not present

## 2015-05-01 DIAGNOSIS — M47816 Spondylosis without myelopathy or radiculopathy, lumbar region: Secondary | ICD-10-CM | POA: Diagnosis not present

## 2015-05-01 DIAGNOSIS — M1990 Unspecified osteoarthritis, unspecified site: Secondary | ICD-10-CM | POA: Diagnosis not present

## 2015-05-03 DIAGNOSIS — Z6831 Body mass index (BMI) 31.0-31.9, adult: Secondary | ICD-10-CM | POA: Diagnosis not present

## 2015-05-03 DIAGNOSIS — M1990 Unspecified osteoarthritis, unspecified site: Secondary | ICD-10-CM | POA: Diagnosis not present

## 2015-05-03 DIAGNOSIS — Z471 Aftercare following joint replacement surgery: Secondary | ICD-10-CM | POA: Diagnosis not present

## 2015-05-03 DIAGNOSIS — M47816 Spondylosis without myelopathy or radiculopathy, lumbar region: Secondary | ICD-10-CM | POA: Diagnosis not present

## 2015-05-03 DIAGNOSIS — Z96651 Presence of right artificial knee joint: Secondary | ICD-10-CM | POA: Diagnosis not present

## 2015-05-07 DIAGNOSIS — M1711 Unilateral primary osteoarthritis, right knee: Secondary | ICD-10-CM | POA: Diagnosis not present

## 2015-05-07 DIAGNOSIS — M25661 Stiffness of right knee, not elsewhere classified: Secondary | ICD-10-CM | POA: Diagnosis not present

## 2015-05-07 DIAGNOSIS — R2689 Other abnormalities of gait and mobility: Secondary | ICD-10-CM | POA: Diagnosis not present

## 2015-05-07 DIAGNOSIS — M6281 Muscle weakness (generalized): Secondary | ICD-10-CM | POA: Diagnosis not present

## 2015-05-07 DIAGNOSIS — M25561 Pain in right knee: Secondary | ICD-10-CM | POA: Diagnosis not present

## 2015-05-09 DIAGNOSIS — M1711 Unilateral primary osteoarthritis, right knee: Secondary | ICD-10-CM | POA: Diagnosis not present

## 2015-05-09 DIAGNOSIS — M25561 Pain in right knee: Secondary | ICD-10-CM | POA: Diagnosis not present

## 2015-05-09 DIAGNOSIS — M25661 Stiffness of right knee, not elsewhere classified: Secondary | ICD-10-CM | POA: Diagnosis not present

## 2015-05-09 DIAGNOSIS — R2689 Other abnormalities of gait and mobility: Secondary | ICD-10-CM | POA: Diagnosis not present

## 2015-05-09 DIAGNOSIS — M6281 Muscle weakness (generalized): Secondary | ICD-10-CM | POA: Diagnosis not present

## 2015-05-13 DIAGNOSIS — M25561 Pain in right knee: Secondary | ICD-10-CM | POA: Diagnosis not present

## 2015-05-13 DIAGNOSIS — M6281 Muscle weakness (generalized): Secondary | ICD-10-CM | POA: Diagnosis not present

## 2015-05-13 DIAGNOSIS — R2689 Other abnormalities of gait and mobility: Secondary | ICD-10-CM | POA: Diagnosis not present

## 2015-05-13 DIAGNOSIS — M1711 Unilateral primary osteoarthritis, right knee: Secondary | ICD-10-CM | POA: Diagnosis not present

## 2015-05-13 DIAGNOSIS — M25661 Stiffness of right knee, not elsewhere classified: Secondary | ICD-10-CM | POA: Diagnosis not present

## 2015-05-16 DIAGNOSIS — M1711 Unilateral primary osteoarthritis, right knee: Secondary | ICD-10-CM | POA: Diagnosis not present

## 2015-05-16 DIAGNOSIS — M25661 Stiffness of right knee, not elsewhere classified: Secondary | ICD-10-CM | POA: Diagnosis not present

## 2015-05-16 DIAGNOSIS — R2689 Other abnormalities of gait and mobility: Secondary | ICD-10-CM | POA: Diagnosis not present

## 2015-05-16 DIAGNOSIS — M6281 Muscle weakness (generalized): Secondary | ICD-10-CM | POA: Diagnosis not present

## 2015-05-16 DIAGNOSIS — M25561 Pain in right knee: Secondary | ICD-10-CM | POA: Diagnosis not present

## 2015-05-21 DIAGNOSIS — M25661 Stiffness of right knee, not elsewhere classified: Secondary | ICD-10-CM | POA: Diagnosis not present

## 2015-05-21 DIAGNOSIS — R2689 Other abnormalities of gait and mobility: Secondary | ICD-10-CM | POA: Diagnosis not present

## 2015-05-21 DIAGNOSIS — M6281 Muscle weakness (generalized): Secondary | ICD-10-CM | POA: Diagnosis not present

## 2015-05-21 DIAGNOSIS — M1711 Unilateral primary osteoarthritis, right knee: Secondary | ICD-10-CM | POA: Diagnosis not present

## 2015-05-21 DIAGNOSIS — M25561 Pain in right knee: Secondary | ICD-10-CM | POA: Diagnosis not present

## 2015-05-24 DIAGNOSIS — M1711 Unilateral primary osteoarthritis, right knee: Secondary | ICD-10-CM | POA: Diagnosis not present

## 2015-05-24 DIAGNOSIS — M6281 Muscle weakness (generalized): Secondary | ICD-10-CM | POA: Diagnosis not present

## 2015-05-24 DIAGNOSIS — M25661 Stiffness of right knee, not elsewhere classified: Secondary | ICD-10-CM | POA: Diagnosis not present

## 2015-05-24 DIAGNOSIS — R2689 Other abnormalities of gait and mobility: Secondary | ICD-10-CM | POA: Diagnosis not present

## 2015-05-24 DIAGNOSIS — M25561 Pain in right knee: Secondary | ICD-10-CM | POA: Diagnosis not present

## 2015-05-28 DIAGNOSIS — M1711 Unilateral primary osteoarthritis, right knee: Secondary | ICD-10-CM | POA: Diagnosis not present

## 2015-05-28 DIAGNOSIS — R2689 Other abnormalities of gait and mobility: Secondary | ICD-10-CM | POA: Diagnosis not present

## 2015-05-28 DIAGNOSIS — M6281 Muscle weakness (generalized): Secondary | ICD-10-CM | POA: Diagnosis not present

## 2015-05-28 DIAGNOSIS — M25661 Stiffness of right knee, not elsewhere classified: Secondary | ICD-10-CM | POA: Diagnosis not present

## 2015-05-28 DIAGNOSIS — M25561 Pain in right knee: Secondary | ICD-10-CM | POA: Diagnosis not present

## 2015-05-29 DIAGNOSIS — F329 Major depressive disorder, single episode, unspecified: Secondary | ICD-10-CM | POA: Diagnosis not present

## 2015-05-29 DIAGNOSIS — Z6833 Body mass index (BMI) 33.0-33.9, adult: Secondary | ICD-10-CM | POA: Diagnosis not present

## 2015-05-29 DIAGNOSIS — I10 Essential (primary) hypertension: Secondary | ICD-10-CM | POA: Diagnosis not present

## 2015-05-29 DIAGNOSIS — K439 Ventral hernia without obstruction or gangrene: Secondary | ICD-10-CM | POA: Diagnosis not present

## 2015-05-30 DIAGNOSIS — M1711 Unilateral primary osteoarthritis, right knee: Secondary | ICD-10-CM | POA: Diagnosis not present

## 2015-05-30 DIAGNOSIS — M6281 Muscle weakness (generalized): Secondary | ICD-10-CM | POA: Diagnosis not present

## 2015-05-30 DIAGNOSIS — M25661 Stiffness of right knee, not elsewhere classified: Secondary | ICD-10-CM | POA: Diagnosis not present

## 2015-05-30 DIAGNOSIS — M25561 Pain in right knee: Secondary | ICD-10-CM | POA: Diagnosis not present

## 2015-05-30 DIAGNOSIS — R2689 Other abnormalities of gait and mobility: Secondary | ICD-10-CM | POA: Diagnosis not present

## 2015-06-03 DIAGNOSIS — Z96651 Presence of right artificial knee joint: Secondary | ICD-10-CM | POA: Diagnosis not present

## 2015-06-03 DIAGNOSIS — M25561 Pain in right knee: Secondary | ICD-10-CM | POA: Diagnosis not present

## 2015-06-03 DIAGNOSIS — M1711 Unilateral primary osteoarthritis, right knee: Secondary | ICD-10-CM | POA: Diagnosis not present

## 2015-06-03 DIAGNOSIS — M6281 Muscle weakness (generalized): Secondary | ICD-10-CM | POA: Diagnosis not present

## 2015-06-03 DIAGNOSIS — R2689 Other abnormalities of gait and mobility: Secondary | ICD-10-CM | POA: Diagnosis not present

## 2015-06-03 DIAGNOSIS — M25661 Stiffness of right knee, not elsewhere classified: Secondary | ICD-10-CM | POA: Diagnosis not present

## 2015-06-05 DIAGNOSIS — M1711 Unilateral primary osteoarthritis, right knee: Secondary | ICD-10-CM | POA: Diagnosis not present

## 2015-06-05 DIAGNOSIS — R2689 Other abnormalities of gait and mobility: Secondary | ICD-10-CM | POA: Diagnosis not present

## 2015-06-05 DIAGNOSIS — M25561 Pain in right knee: Secondary | ICD-10-CM | POA: Diagnosis not present

## 2015-06-05 DIAGNOSIS — M25661 Stiffness of right knee, not elsewhere classified: Secondary | ICD-10-CM | POA: Diagnosis not present

## 2015-06-05 DIAGNOSIS — M6281 Muscle weakness (generalized): Secondary | ICD-10-CM | POA: Diagnosis not present

## 2015-06-11 DIAGNOSIS — M25661 Stiffness of right knee, not elsewhere classified: Secondary | ICD-10-CM | POA: Diagnosis not present

## 2015-06-11 DIAGNOSIS — M25561 Pain in right knee: Secondary | ICD-10-CM | POA: Diagnosis not present

## 2015-06-11 DIAGNOSIS — M1711 Unilateral primary osteoarthritis, right knee: Secondary | ICD-10-CM | POA: Diagnosis not present

## 2015-06-11 DIAGNOSIS — M6281 Muscle weakness (generalized): Secondary | ICD-10-CM | POA: Diagnosis not present

## 2015-06-11 DIAGNOSIS — R2689 Other abnormalities of gait and mobility: Secondary | ICD-10-CM | POA: Diagnosis not present

## 2015-06-19 DIAGNOSIS — M25661 Stiffness of right knee, not elsewhere classified: Secondary | ICD-10-CM | POA: Diagnosis not present

## 2015-06-19 DIAGNOSIS — R2689 Other abnormalities of gait and mobility: Secondary | ICD-10-CM | POA: Diagnosis not present

## 2015-06-19 DIAGNOSIS — M1711 Unilateral primary osteoarthritis, right knee: Secondary | ICD-10-CM | POA: Diagnosis not present

## 2015-06-19 DIAGNOSIS — M25561 Pain in right knee: Secondary | ICD-10-CM | POA: Diagnosis not present

## 2015-06-19 DIAGNOSIS — M6281 Muscle weakness (generalized): Secondary | ICD-10-CM | POA: Diagnosis not present

## 2015-07-29 DIAGNOSIS — L723 Sebaceous cyst: Secondary | ICD-10-CM | POA: Diagnosis not present

## 2015-10-15 DIAGNOSIS — M545 Low back pain: Secondary | ICD-10-CM | POA: Diagnosis not present

## 2015-10-15 DIAGNOSIS — R109 Unspecified abdominal pain: Secondary | ICD-10-CM | POA: Diagnosis not present

## 2015-10-15 DIAGNOSIS — M1710 Unilateral primary osteoarthritis, unspecified knee: Secondary | ICD-10-CM | POA: Diagnosis not present

## 2015-10-15 DIAGNOSIS — Z6834 Body mass index (BMI) 34.0-34.9, adult: Secondary | ICD-10-CM | POA: Diagnosis not present

## 2015-10-16 DIAGNOSIS — M1711 Unilateral primary osteoarthritis, right knee: Secondary | ICD-10-CM | POA: Diagnosis not present

## 2015-10-16 DIAGNOSIS — Z96651 Presence of right artificial knee joint: Secondary | ICD-10-CM | POA: Diagnosis not present

## 2015-10-29 DIAGNOSIS — K432 Incisional hernia without obstruction or gangrene: Secondary | ICD-10-CM | POA: Diagnosis not present

## 2015-11-21 DIAGNOSIS — Z01810 Encounter for preprocedural cardiovascular examination: Secondary | ICD-10-CM | POA: Diagnosis not present

## 2015-11-21 DIAGNOSIS — I2510 Atherosclerotic heart disease of native coronary artery without angina pectoris: Secondary | ICD-10-CM | POA: Diagnosis not present

## 2015-12-02 DIAGNOSIS — I700 Atherosclerosis of aorta: Secondary | ICD-10-CM | POA: Diagnosis not present

## 2015-12-02 DIAGNOSIS — I351 Nonrheumatic aortic (valve) insufficiency: Secondary | ICD-10-CM | POA: Diagnosis not present

## 2015-12-02 DIAGNOSIS — Z01810 Encounter for preprocedural cardiovascular examination: Secondary | ICD-10-CM | POA: Diagnosis not present

## 2015-12-02 DIAGNOSIS — I517 Cardiomegaly: Secondary | ICD-10-CM | POA: Diagnosis not present

## 2015-12-02 DIAGNOSIS — I2510 Atherosclerotic heart disease of native coronary artery without angina pectoris: Secondary | ICD-10-CM | POA: Diagnosis not present

## 2015-12-03 DIAGNOSIS — I2510 Atherosclerotic heart disease of native coronary artery without angina pectoris: Secondary | ICD-10-CM | POA: Diagnosis not present

## 2015-12-03 DIAGNOSIS — R0600 Dyspnea, unspecified: Secondary | ICD-10-CM | POA: Diagnosis not present

## 2015-12-05 DIAGNOSIS — Z6833 Body mass index (BMI) 33.0-33.9, adult: Secondary | ICD-10-CM | POA: Diagnosis not present

## 2015-12-05 DIAGNOSIS — I10 Essential (primary) hypertension: Secondary | ICD-10-CM | POA: Diagnosis not present

## 2015-12-05 DIAGNOSIS — Z23 Encounter for immunization: Secondary | ICD-10-CM | POA: Diagnosis not present

## 2015-12-05 DIAGNOSIS — Z01810 Encounter for preprocedural cardiovascular examination: Secondary | ICD-10-CM | POA: Diagnosis not present

## 2015-12-05 DIAGNOSIS — Z79899 Other long term (current) drug therapy: Secondary | ICD-10-CM | POA: Diagnosis not present

## 2015-12-05 DIAGNOSIS — M1710 Unilateral primary osteoarthritis, unspecified knee: Secondary | ICD-10-CM | POA: Diagnosis not present

## 2015-12-05 DIAGNOSIS — F329 Major depressive disorder, single episode, unspecified: Secondary | ICD-10-CM | POA: Diagnosis not present

## 2015-12-05 DIAGNOSIS — I2510 Atherosclerotic heart disease of native coronary artery without angina pectoris: Secondary | ICD-10-CM | POA: Diagnosis not present

## 2015-12-06 DIAGNOSIS — Z79899 Other long term (current) drug therapy: Secondary | ICD-10-CM | POA: Diagnosis not present

## 2015-12-16 DIAGNOSIS — M25561 Pain in right knee: Secondary | ICD-10-CM | POA: Diagnosis not present

## 2015-12-16 DIAGNOSIS — Z6834 Body mass index (BMI) 34.0-34.9, adult: Secondary | ICD-10-CM | POA: Diagnosis not present

## 2015-12-16 DIAGNOSIS — K469 Unspecified abdominal hernia without obstruction or gangrene: Secondary | ICD-10-CM | POA: Diagnosis not present

## 2015-12-23 DIAGNOSIS — K432 Incisional hernia without obstruction or gangrene: Secondary | ICD-10-CM | POA: Diagnosis not present

## 2015-12-27 DIAGNOSIS — M1712 Unilateral primary osteoarthritis, left knee: Secondary | ICD-10-CM | POA: Diagnosis not present

## 2015-12-27 DIAGNOSIS — M25562 Pain in left knee: Secondary | ICD-10-CM | POA: Diagnosis not present

## 2015-12-30 DIAGNOSIS — M1712 Unilateral primary osteoarthritis, left knee: Secondary | ICD-10-CM | POA: Diagnosis not present

## 2016-01-06 DIAGNOSIS — M1712 Unilateral primary osteoarthritis, left knee: Secondary | ICD-10-CM | POA: Diagnosis not present

## 2016-01-13 DIAGNOSIS — M1712 Unilateral primary osteoarthritis, left knee: Secondary | ICD-10-CM | POA: Diagnosis not present

## 2016-01-30 DIAGNOSIS — K432 Incisional hernia without obstruction or gangrene: Secondary | ICD-10-CM | POA: Diagnosis not present

## 2016-01-30 DIAGNOSIS — E669 Obesity, unspecified: Secondary | ICD-10-CM | POA: Diagnosis not present

## 2016-01-30 DIAGNOSIS — Z6834 Body mass index (BMI) 34.0-34.9, adult: Secondary | ICD-10-CM | POA: Diagnosis not present

## 2016-01-30 DIAGNOSIS — Z720 Tobacco use: Secondary | ICD-10-CM | POA: Diagnosis not present

## 2016-01-30 DIAGNOSIS — I2510 Atherosclerotic heart disease of native coronary artery without angina pectoris: Secondary | ICD-10-CM | POA: Diagnosis not present

## 2016-02-12 DIAGNOSIS — I10 Essential (primary) hypertension: Secondary | ICD-10-CM | POA: Diagnosis not present

## 2016-02-12 DIAGNOSIS — Z125 Encounter for screening for malignant neoplasm of prostate: Secondary | ICD-10-CM | POA: Diagnosis not present

## 2016-02-13 DIAGNOSIS — H11003 Unspecified pterygium of eye, bilateral: Secondary | ICD-10-CM | POA: Diagnosis not present

## 2016-02-13 DIAGNOSIS — H26493 Other secondary cataract, bilateral: Secondary | ICD-10-CM | POA: Diagnosis not present

## 2016-03-10 DIAGNOSIS — R7303 Prediabetes: Secondary | ICD-10-CM | POA: Diagnosis not present

## 2016-03-10 DIAGNOSIS — M1710 Unilateral primary osteoarthritis, unspecified knee: Secondary | ICD-10-CM | POA: Diagnosis not present

## 2016-05-19 DIAGNOSIS — K439 Ventral hernia without obstruction or gangrene: Secondary | ICD-10-CM | POA: Diagnosis not present

## 2016-06-01 DIAGNOSIS — Z980 Intestinal bypass and anastomosis status: Secondary | ICD-10-CM | POA: Diagnosis not present

## 2016-06-01 DIAGNOSIS — K640 First degree hemorrhoids: Secondary | ICD-10-CM | POA: Diagnosis not present

## 2016-06-01 DIAGNOSIS — Z1211 Encounter for screening for malignant neoplasm of colon: Secondary | ICD-10-CM | POA: Diagnosis not present

## 2016-06-10 DIAGNOSIS — M109 Gout, unspecified: Secondary | ICD-10-CM | POA: Diagnosis not present

## 2016-06-10 DIAGNOSIS — F17210 Nicotine dependence, cigarettes, uncomplicated: Secondary | ICD-10-CM | POA: Diagnosis not present

## 2016-06-10 DIAGNOSIS — M1990 Unspecified osteoarthritis, unspecified site: Secondary | ICD-10-CM | POA: Diagnosis not present

## 2016-06-10 DIAGNOSIS — Z9049 Acquired absence of other specified parts of digestive tract: Secondary | ICD-10-CM | POA: Diagnosis not present

## 2016-06-10 DIAGNOSIS — R262 Difficulty in walking, not elsewhere classified: Secondary | ICD-10-CM | POA: Diagnosis not present

## 2016-06-10 DIAGNOSIS — Z833 Family history of diabetes mellitus: Secondary | ICD-10-CM | POA: Diagnosis not present

## 2016-06-10 DIAGNOSIS — Z8249 Family history of ischemic heart disease and other diseases of the circulatory system: Secondary | ICD-10-CM | POA: Diagnosis not present

## 2016-06-10 DIAGNOSIS — K432 Incisional hernia without obstruction or gangrene: Secondary | ICD-10-CM | POA: Diagnosis not present

## 2016-06-10 DIAGNOSIS — G479 Sleep disorder, unspecified: Secondary | ICD-10-CM | POA: Diagnosis not present

## 2016-06-10 DIAGNOSIS — I10 Essential (primary) hypertension: Secondary | ICD-10-CM | POA: Diagnosis not present

## 2016-06-10 DIAGNOSIS — F329 Major depressive disorder, single episode, unspecified: Secondary | ICD-10-CM | POA: Diagnosis not present

## 2016-06-10 DIAGNOSIS — Z7982 Long term (current) use of aspirin: Secondary | ICD-10-CM | POA: Diagnosis not present

## 2016-06-10 DIAGNOSIS — Z713 Dietary counseling and surveillance: Secondary | ICD-10-CM | POA: Diagnosis not present

## 2016-06-10 DIAGNOSIS — R0683 Snoring: Secondary | ICD-10-CM | POA: Diagnosis not present

## 2016-06-10 DIAGNOSIS — K439 Ventral hernia without obstruction or gangrene: Secondary | ICD-10-CM | POA: Diagnosis not present

## 2016-06-10 DIAGNOSIS — E669 Obesity, unspecified: Secondary | ICD-10-CM | POA: Diagnosis not present

## 2016-06-10 DIAGNOSIS — Z79899 Other long term (current) drug therapy: Secondary | ICD-10-CM | POA: Diagnosis not present

## 2016-06-10 DIAGNOSIS — Z6834 Body mass index (BMI) 34.0-34.9, adult: Secondary | ICD-10-CM | POA: Diagnosis not present

## 2016-06-11 DIAGNOSIS — I10 Essential (primary) hypertension: Secondary | ICD-10-CM | POA: Diagnosis not present

## 2016-06-11 DIAGNOSIS — R0683 Snoring: Secondary | ICD-10-CM | POA: Diagnosis not present

## 2016-06-11 DIAGNOSIS — E7800 Pure hypercholesterolemia, unspecified: Secondary | ICD-10-CM | POA: Diagnosis not present

## 2016-06-11 DIAGNOSIS — F329 Major depressive disorder, single episode, unspecified: Secondary | ICD-10-CM | POA: Diagnosis not present

## 2016-06-11 DIAGNOSIS — R7303 Prediabetes: Secondary | ICD-10-CM | POA: Diagnosis not present

## 2016-06-11 DIAGNOSIS — E669 Obesity, unspecified: Secondary | ICD-10-CM | POA: Diagnosis not present

## 2016-06-11 DIAGNOSIS — Z0000 Encounter for general adult medical examination without abnormal findings: Secondary | ICD-10-CM | POA: Diagnosis not present

## 2016-06-11 DIAGNOSIS — K469 Unspecified abdominal hernia without obstruction or gangrene: Secondary | ICD-10-CM | POA: Diagnosis not present

## 2016-06-11 DIAGNOSIS — M1710 Unilateral primary osteoarthritis, unspecified knee: Secondary | ICD-10-CM | POA: Diagnosis not present

## 2016-06-11 DIAGNOSIS — Z9181 History of falling: Secondary | ICD-10-CM | POA: Diagnosis not present

## 2016-07-23 DIAGNOSIS — K439 Ventral hernia without obstruction or gangrene: Secondary | ICD-10-CM | POA: Diagnosis not present

## 2016-09-11 DIAGNOSIS — E669 Obesity, unspecified: Secondary | ICD-10-CM | POA: Diagnosis not present

## 2016-09-11 DIAGNOSIS — Z6834 Body mass index (BMI) 34.0-34.9, adult: Secondary | ICD-10-CM | POA: Diagnosis not present

## 2016-09-11 DIAGNOSIS — E6609 Other obesity due to excess calories: Secondary | ICD-10-CM | POA: Diagnosis not present

## 2016-09-24 DIAGNOSIS — M1710 Unilateral primary osteoarthritis, unspecified knee: Secondary | ICD-10-CM | POA: Diagnosis not present

## 2016-09-24 DIAGNOSIS — I10 Essential (primary) hypertension: Secondary | ICD-10-CM | POA: Diagnosis not present

## 2016-09-24 DIAGNOSIS — Z6833 Body mass index (BMI) 33.0-33.9, adult: Secondary | ICD-10-CM | POA: Diagnosis not present

## 2016-09-24 DIAGNOSIS — Z79899 Other long term (current) drug therapy: Secondary | ICD-10-CM | POA: Diagnosis not present

## 2016-09-24 DIAGNOSIS — F329 Major depressive disorder, single episode, unspecified: Secondary | ICD-10-CM | POA: Diagnosis not present

## 2016-09-24 DIAGNOSIS — Z0100 Encounter for examination of eyes and vision without abnormal findings: Secondary | ICD-10-CM | POA: Diagnosis not present

## 2016-09-24 DIAGNOSIS — E7800 Pure hypercholesterolemia, unspecified: Secondary | ICD-10-CM | POA: Diagnosis not present

## 2016-10-02 DIAGNOSIS — F329 Major depressive disorder, single episode, unspecified: Secondary | ICD-10-CM | POA: Diagnosis not present

## 2016-10-02 DIAGNOSIS — I2510 Atherosclerotic heart disease of native coronary artery without angina pectoris: Secondary | ICD-10-CM | POA: Diagnosis not present

## 2016-10-02 DIAGNOSIS — Z01818 Encounter for other preprocedural examination: Secondary | ICD-10-CM | POA: Diagnosis not present

## 2016-10-02 DIAGNOSIS — I10 Essential (primary) hypertension: Secondary | ICD-10-CM | POA: Diagnosis not present

## 2016-10-14 DIAGNOSIS — Z87891 Personal history of nicotine dependence: Secondary | ICD-10-CM | POA: Diagnosis not present

## 2016-10-14 DIAGNOSIS — E669 Obesity, unspecified: Secondary | ICD-10-CM | POA: Diagnosis present

## 2016-10-14 DIAGNOSIS — T8143XA Infection following a procedure, organ and space surgical site, initial encounter: Secondary | ICD-10-CM | POA: Diagnosis not present

## 2016-10-14 DIAGNOSIS — Z4682 Encounter for fitting and adjustment of non-vascular catheter: Secondary | ICD-10-CM | POA: Diagnosis not present

## 2016-10-14 DIAGNOSIS — K651 Peritoneal abscess: Secondary | ICD-10-CM | POA: Diagnosis not present

## 2016-10-14 DIAGNOSIS — T85518A Breakdown (mechanical) of other gastrointestinal prosthetic devices, implants and grafts, initial encounter: Secondary | ICD-10-CM | POA: Diagnosis present

## 2016-10-14 DIAGNOSIS — M109 Gout, unspecified: Secondary | ICD-10-CM | POA: Diagnosis present

## 2016-10-14 DIAGNOSIS — I96 Gangrene, not elsewhere classified: Secondary | ICD-10-CM | POA: Diagnosis not present

## 2016-10-14 DIAGNOSIS — N179 Acute kidney failure, unspecified: Secondary | ICD-10-CM | POA: Diagnosis not present

## 2016-10-14 DIAGNOSIS — K660 Peritoneal adhesions (postprocedural) (postinfection): Secondary | ICD-10-CM | POA: Diagnosis not present

## 2016-10-14 DIAGNOSIS — Z9911 Dependence on respirator [ventilator] status: Secondary | ICD-10-CM | POA: Diagnosis not present

## 2016-10-14 DIAGNOSIS — E878 Other disorders of electrolyte and fluid balance, not elsewhere classified: Secondary | ICD-10-CM | POA: Diagnosis not present

## 2016-10-14 DIAGNOSIS — J90 Pleural effusion, not elsewhere classified: Secondary | ICD-10-CM | POA: Diagnosis not present

## 2016-10-14 DIAGNOSIS — R9431 Abnormal electrocardiogram [ECG] [EKG]: Secondary | ICD-10-CM | POA: Diagnosis not present

## 2016-10-14 DIAGNOSIS — K30 Functional dyspepsia: Secondary | ICD-10-CM | POA: Diagnosis not present

## 2016-10-14 DIAGNOSIS — Z7982 Long term (current) use of aspirin: Secondary | ICD-10-CM | POA: Diagnosis not present

## 2016-10-14 DIAGNOSIS — K567 Ileus, unspecified: Secondary | ICD-10-CM | POA: Diagnosis not present

## 2016-10-14 DIAGNOSIS — R188 Other ascites: Secondary | ICD-10-CM | POA: Diagnosis not present

## 2016-10-14 DIAGNOSIS — R918 Other nonspecific abnormal finding of lung field: Secondary | ICD-10-CM | POA: Diagnosis not present

## 2016-10-14 DIAGNOSIS — K430 Incisional hernia with obstruction, without gangrene: Secondary | ICD-10-CM | POA: Diagnosis not present

## 2016-10-14 DIAGNOSIS — Z791 Long term (current) use of non-steroidal anti-inflammatories (NSAID): Secondary | ICD-10-CM | POA: Diagnosis not present

## 2016-10-14 DIAGNOSIS — J9801 Acute bronchospasm: Secondary | ICD-10-CM | POA: Diagnosis not present

## 2016-10-14 DIAGNOSIS — Z452 Encounter for adjustment and management of vascular access device: Secondary | ICD-10-CM | POA: Diagnosis not present

## 2016-10-14 DIAGNOSIS — Z6834 Body mass index (BMI) 34.0-34.9, adult: Secondary | ICD-10-CM | POA: Diagnosis not present

## 2016-10-14 DIAGNOSIS — K565 Intestinal adhesions [bands], unspecified as to partial versus complete obstruction: Secondary | ICD-10-CM | POA: Diagnosis not present

## 2016-10-14 DIAGNOSIS — D62 Acute posthemorrhagic anemia: Secondary | ICD-10-CM | POA: Diagnosis not present

## 2016-10-14 DIAGNOSIS — F329 Major depressive disorder, single episode, unspecified: Secondary | ICD-10-CM | POA: Diagnosis present

## 2016-10-14 DIAGNOSIS — M1990 Unspecified osteoarthritis, unspecified site: Secondary | ICD-10-CM | POA: Diagnosis present

## 2016-10-14 DIAGNOSIS — T8183XA Persistent postprocedural fistula, initial encounter: Secondary | ICD-10-CM | POA: Diagnosis not present

## 2016-10-14 DIAGNOSIS — F325 Major depressive disorder, single episode, in full remission: Secondary | ICD-10-CM | POA: Diagnosis not present

## 2016-10-14 DIAGNOSIS — K632 Fistula of intestine: Secondary | ICD-10-CM | POA: Diagnosis not present

## 2016-10-14 DIAGNOSIS — K9189 Other postprocedural complications and disorders of digestive system: Secondary | ICD-10-CM | POA: Diagnosis not present

## 2016-10-14 DIAGNOSIS — K6389 Other specified diseases of intestine: Secondary | ICD-10-CM | POA: Diagnosis not present

## 2016-10-14 DIAGNOSIS — R0989 Other specified symptoms and signs involving the circulatory and respiratory systems: Secondary | ICD-10-CM | POA: Diagnosis not present

## 2016-10-14 DIAGNOSIS — R0689 Other abnormalities of breathing: Secondary | ICD-10-CM | POA: Diagnosis not present

## 2016-10-14 DIAGNOSIS — J952 Acute pulmonary insufficiency following nonthoracic surgery: Secondary | ICD-10-CM | POA: Diagnosis not present

## 2016-10-14 DIAGNOSIS — R000 Tachycardia, unspecified: Secondary | ICD-10-CM | POA: Diagnosis not present

## 2016-10-14 DIAGNOSIS — I4581 Long QT syndrome: Secondary | ICD-10-CM | POA: Diagnosis not present

## 2016-10-14 DIAGNOSIS — K439 Ventral hernia without obstruction or gangrene: Secondary | ICD-10-CM | POA: Diagnosis not present

## 2016-10-14 DIAGNOSIS — M6281 Muscle weakness (generalized): Secondary | ICD-10-CM | POA: Diagnosis not present

## 2016-10-14 DIAGNOSIS — I10 Essential (primary) hypertension: Secondary | ICD-10-CM | POA: Diagnosis present

## 2016-10-14 DIAGNOSIS — L02211 Cutaneous abscess of abdominal wall: Secondary | ICD-10-CM | POA: Diagnosis not present

## 2016-10-14 DIAGNOSIS — I252 Old myocardial infarction: Secondary | ICD-10-CM | POA: Diagnosis not present

## 2016-10-14 DIAGNOSIS — R443 Hallucinations, unspecified: Secondary | ICD-10-CM | POA: Diagnosis not present

## 2016-10-16 DIAGNOSIS — E669 Obesity, unspecified: Secondary | ICD-10-CM | POA: Insufficient documentation

## 2016-11-12 DIAGNOSIS — F3340 Major depressive disorder, recurrent, in remission, unspecified: Secondary | ICD-10-CM | POA: Insufficient documentation

## 2016-11-16 DIAGNOSIS — T8143XA Infection following a procedure, organ and space surgical site, initial encounter: Secondary | ICD-10-CM | POA: Diagnosis not present

## 2016-11-16 DIAGNOSIS — T8183XA Persistent postprocedural fistula, initial encounter: Secondary | ICD-10-CM | POA: Diagnosis not present

## 2016-11-16 DIAGNOSIS — M6281 Muscle weakness (generalized): Secondary | ICD-10-CM | POA: Diagnosis not present

## 2016-11-16 DIAGNOSIS — K632 Fistula of intestine: Secondary | ICD-10-CM | POA: Diagnosis not present

## 2016-11-16 DIAGNOSIS — Z452 Encounter for adjustment and management of vascular access device: Secondary | ICD-10-CM | POA: Diagnosis not present

## 2016-11-16 DIAGNOSIS — K651 Peritoneal abscess: Secondary | ICD-10-CM | POA: Diagnosis not present

## 2016-11-17 DIAGNOSIS — T8143XA Infection following a procedure, organ and space surgical site, initial encounter: Secondary | ICD-10-CM | POA: Diagnosis not present

## 2016-11-17 DIAGNOSIS — Z452 Encounter for adjustment and management of vascular access device: Secondary | ICD-10-CM | POA: Diagnosis not present

## 2016-11-17 DIAGNOSIS — M6281 Muscle weakness (generalized): Secondary | ICD-10-CM | POA: Diagnosis not present

## 2016-11-17 DIAGNOSIS — K632 Fistula of intestine: Secondary | ICD-10-CM | POA: Diagnosis not present

## 2016-11-17 DIAGNOSIS — K651 Peritoneal abscess: Secondary | ICD-10-CM | POA: Diagnosis not present

## 2016-11-17 DIAGNOSIS — T8183XA Persistent postprocedural fistula, initial encounter: Secondary | ICD-10-CM | POA: Diagnosis not present

## 2016-11-18 DIAGNOSIS — Z452 Encounter for adjustment and management of vascular access device: Secondary | ICD-10-CM | POA: Diagnosis not present

## 2016-11-18 DIAGNOSIS — T8189XA Other complications of procedures, not elsewhere classified, initial encounter: Secondary | ICD-10-CM | POA: Diagnosis not present

## 2016-11-18 DIAGNOSIS — R109 Unspecified abdominal pain: Secondary | ICD-10-CM | POA: Diagnosis not present

## 2016-11-18 DIAGNOSIS — K632 Fistula of intestine: Secondary | ICD-10-CM | POA: Diagnosis not present

## 2016-11-18 DIAGNOSIS — K432 Incisional hernia without obstruction or gangrene: Secondary | ICD-10-CM | POA: Diagnosis not present

## 2016-11-18 DIAGNOSIS — K651 Peritoneal abscess: Secondary | ICD-10-CM | POA: Diagnosis not present

## 2016-11-18 DIAGNOSIS — Z79899 Other long term (current) drug therapy: Secondary | ICD-10-CM | POA: Diagnosis not present

## 2016-11-18 DIAGNOSIS — F329 Major depressive disorder, single episode, unspecified: Secondary | ICD-10-CM | POA: Diagnosis not present

## 2016-11-18 DIAGNOSIS — Z789 Other specified health status: Secondary | ICD-10-CM | POA: Diagnosis not present

## 2016-11-18 DIAGNOSIS — T8183XA Persistent postprocedural fistula, initial encounter: Secondary | ICD-10-CM | POA: Diagnosis not present

## 2016-11-18 DIAGNOSIS — M6281 Muscle weakness (generalized): Secondary | ICD-10-CM | POA: Diagnosis not present

## 2016-11-18 DIAGNOSIS — T8143XA Infection following a procedure, organ and space surgical site, initial encounter: Secondary | ICD-10-CM | POA: Diagnosis not present

## 2016-11-18 DIAGNOSIS — I10 Essential (primary) hypertension: Secondary | ICD-10-CM | POA: Diagnosis not present

## 2016-11-19 DIAGNOSIS — Z452 Encounter for adjustment and management of vascular access device: Secondary | ICD-10-CM | POA: Diagnosis not present

## 2016-11-19 DIAGNOSIS — T8143XA Infection following a procedure, organ and space surgical site, initial encounter: Secondary | ICD-10-CM | POA: Diagnosis not present

## 2016-11-19 DIAGNOSIS — K651 Peritoneal abscess: Secondary | ICD-10-CM | POA: Diagnosis not present

## 2016-11-19 DIAGNOSIS — T8183XA Persistent postprocedural fistula, initial encounter: Secondary | ICD-10-CM | POA: Diagnosis not present

## 2016-11-19 DIAGNOSIS — K632 Fistula of intestine: Secondary | ICD-10-CM | POA: Diagnosis not present

## 2016-11-19 DIAGNOSIS — M6281 Muscle weakness (generalized): Secondary | ICD-10-CM | POA: Diagnosis not present

## 2016-11-20 DIAGNOSIS — T8183XA Persistent postprocedural fistula, initial encounter: Secondary | ICD-10-CM | POA: Diagnosis not present

## 2016-11-20 DIAGNOSIS — Z452 Encounter for adjustment and management of vascular access device: Secondary | ICD-10-CM | POA: Diagnosis not present

## 2016-11-20 DIAGNOSIS — T8143XA Infection following a procedure, organ and space surgical site, initial encounter: Secondary | ICD-10-CM | POA: Diagnosis not present

## 2016-11-20 DIAGNOSIS — K632 Fistula of intestine: Secondary | ICD-10-CM | POA: Diagnosis not present

## 2016-11-20 DIAGNOSIS — E669 Obesity, unspecified: Secondary | ICD-10-CM | POA: Diagnosis not present

## 2016-11-20 DIAGNOSIS — K651 Peritoneal abscess: Secondary | ICD-10-CM | POA: Diagnosis not present

## 2016-11-20 DIAGNOSIS — K430 Incisional hernia with obstruction, without gangrene: Secondary | ICD-10-CM | POA: Diagnosis not present

## 2016-11-20 DIAGNOSIS — M6281 Muscle weakness (generalized): Secondary | ICD-10-CM | POA: Diagnosis not present

## 2016-11-22 DIAGNOSIS — M6281 Muscle weakness (generalized): Secondary | ICD-10-CM | POA: Diagnosis not present

## 2016-11-22 DIAGNOSIS — Z452 Encounter for adjustment and management of vascular access device: Secondary | ICD-10-CM | POA: Diagnosis not present

## 2016-11-22 DIAGNOSIS — K632 Fistula of intestine: Secondary | ICD-10-CM | POA: Diagnosis not present

## 2016-11-22 DIAGNOSIS — T8183XA Persistent postprocedural fistula, initial encounter: Secondary | ICD-10-CM | POA: Diagnosis not present

## 2016-11-22 DIAGNOSIS — K651 Peritoneal abscess: Secondary | ICD-10-CM | POA: Diagnosis not present

## 2016-11-22 DIAGNOSIS — T8143XA Infection following a procedure, organ and space surgical site, initial encounter: Secondary | ICD-10-CM | POA: Diagnosis not present

## 2016-11-23 DIAGNOSIS — Z452 Encounter for adjustment and management of vascular access device: Secondary | ICD-10-CM | POA: Diagnosis not present

## 2016-11-23 DIAGNOSIS — K632 Fistula of intestine: Secondary | ICD-10-CM | POA: Diagnosis not present

## 2016-11-23 DIAGNOSIS — T8143XA Infection following a procedure, organ and space surgical site, initial encounter: Secondary | ICD-10-CM | POA: Diagnosis not present

## 2016-11-23 DIAGNOSIS — M6281 Muscle weakness (generalized): Secondary | ICD-10-CM | POA: Diagnosis not present

## 2016-11-23 DIAGNOSIS — K651 Peritoneal abscess: Secondary | ICD-10-CM | POA: Diagnosis not present

## 2016-11-23 DIAGNOSIS — T8183XA Persistent postprocedural fistula, initial encounter: Secondary | ICD-10-CM | POA: Diagnosis not present

## 2016-11-24 DIAGNOSIS — Z742 Need for assistance at home and no other household member able to render care: Secondary | ICD-10-CM | POA: Diagnosis not present

## 2016-11-25 DIAGNOSIS — T8143XA Infection following a procedure, organ and space surgical site, initial encounter: Secondary | ICD-10-CM | POA: Diagnosis not present

## 2016-11-25 DIAGNOSIS — T8183XA Persistent postprocedural fistula, initial encounter: Secondary | ICD-10-CM | POA: Diagnosis not present

## 2016-11-25 DIAGNOSIS — M6281 Muscle weakness (generalized): Secondary | ICD-10-CM | POA: Diagnosis not present

## 2016-11-25 DIAGNOSIS — K651 Peritoneal abscess: Secondary | ICD-10-CM | POA: Diagnosis not present

## 2016-11-25 DIAGNOSIS — Z452 Encounter for adjustment and management of vascular access device: Secondary | ICD-10-CM | POA: Diagnosis not present

## 2016-11-25 DIAGNOSIS — K632 Fistula of intestine: Secondary | ICD-10-CM | POA: Diagnosis not present

## 2016-11-27 DIAGNOSIS — T8183XA Persistent postprocedural fistula, initial encounter: Secondary | ICD-10-CM | POA: Diagnosis not present

## 2016-11-27 DIAGNOSIS — T8143XA Infection following a procedure, organ and space surgical site, initial encounter: Secondary | ICD-10-CM | POA: Diagnosis not present

## 2016-11-27 DIAGNOSIS — K632 Fistula of intestine: Secondary | ICD-10-CM | POA: Diagnosis not present

## 2016-11-27 DIAGNOSIS — Z452 Encounter for adjustment and management of vascular access device: Secondary | ICD-10-CM | POA: Diagnosis not present

## 2016-11-27 DIAGNOSIS — K651 Peritoneal abscess: Secondary | ICD-10-CM | POA: Diagnosis not present

## 2016-11-27 DIAGNOSIS — M6281 Muscle weakness (generalized): Secondary | ICD-10-CM | POA: Diagnosis not present

## 2016-11-29 DIAGNOSIS — T8183XA Persistent postprocedural fistula, initial encounter: Secondary | ICD-10-CM | POA: Diagnosis not present

## 2016-11-29 DIAGNOSIS — M6281 Muscle weakness (generalized): Secondary | ICD-10-CM | POA: Diagnosis not present

## 2016-11-29 DIAGNOSIS — K651 Peritoneal abscess: Secondary | ICD-10-CM | POA: Diagnosis not present

## 2016-11-29 DIAGNOSIS — Z452 Encounter for adjustment and management of vascular access device: Secondary | ICD-10-CM | POA: Diagnosis not present

## 2016-11-29 DIAGNOSIS — K632 Fistula of intestine: Secondary | ICD-10-CM | POA: Diagnosis not present

## 2016-11-29 DIAGNOSIS — T8143XA Infection following a procedure, organ and space surgical site, initial encounter: Secondary | ICD-10-CM | POA: Diagnosis not present

## 2016-11-30 DIAGNOSIS — M6281 Muscle weakness (generalized): Secondary | ICD-10-CM | POA: Diagnosis not present

## 2016-11-30 DIAGNOSIS — T8143XA Infection following a procedure, organ and space surgical site, initial encounter: Secondary | ICD-10-CM | POA: Diagnosis not present

## 2016-11-30 DIAGNOSIS — T8183XA Persistent postprocedural fistula, initial encounter: Secondary | ICD-10-CM | POA: Diagnosis not present

## 2016-11-30 DIAGNOSIS — Z452 Encounter for adjustment and management of vascular access device: Secondary | ICD-10-CM | POA: Diagnosis not present

## 2016-11-30 DIAGNOSIS — K651 Peritoneal abscess: Secondary | ICD-10-CM | POA: Diagnosis not present

## 2016-11-30 DIAGNOSIS — K632 Fistula of intestine: Secondary | ICD-10-CM | POA: Diagnosis not present

## 2016-12-01 DIAGNOSIS — T8183XA Persistent postprocedural fistula, initial encounter: Secondary | ICD-10-CM | POA: Diagnosis not present

## 2016-12-01 DIAGNOSIS — E669 Obesity, unspecified: Secondary | ICD-10-CM | POA: Diagnosis not present

## 2016-12-01 DIAGNOSIS — M6281 Muscle weakness (generalized): Secondary | ICD-10-CM | POA: Diagnosis not present

## 2016-12-01 DIAGNOSIS — T8143XA Infection following a procedure, organ and space surgical site, initial encounter: Secondary | ICD-10-CM | POA: Diagnosis not present

## 2016-12-01 DIAGNOSIS — Z452 Encounter for adjustment and management of vascular access device: Secondary | ICD-10-CM | POA: Diagnosis not present

## 2016-12-01 DIAGNOSIS — K430 Incisional hernia with obstruction, without gangrene: Secondary | ICD-10-CM | POA: Diagnosis not present

## 2016-12-01 DIAGNOSIS — K651 Peritoneal abscess: Secondary | ICD-10-CM | POA: Diagnosis not present

## 2016-12-01 DIAGNOSIS — K632 Fistula of intestine: Secondary | ICD-10-CM | POA: Diagnosis not present

## 2016-12-02 DIAGNOSIS — Z452 Encounter for adjustment and management of vascular access device: Secondary | ICD-10-CM | POA: Diagnosis not present

## 2016-12-02 DIAGNOSIS — T8183XA Persistent postprocedural fistula, initial encounter: Secondary | ICD-10-CM | POA: Diagnosis not present

## 2016-12-02 DIAGNOSIS — K632 Fistula of intestine: Secondary | ICD-10-CM | POA: Diagnosis not present

## 2016-12-02 DIAGNOSIS — T8143XA Infection following a procedure, organ and space surgical site, initial encounter: Secondary | ICD-10-CM | POA: Diagnosis not present

## 2016-12-02 DIAGNOSIS — M6281 Muscle weakness (generalized): Secondary | ICD-10-CM | POA: Diagnosis not present

## 2016-12-02 DIAGNOSIS — K651 Peritoneal abscess: Secondary | ICD-10-CM | POA: Diagnosis not present

## 2016-12-04 DIAGNOSIS — T8143XA Infection following a procedure, organ and space surgical site, initial encounter: Secondary | ICD-10-CM | POA: Diagnosis not present

## 2016-12-04 DIAGNOSIS — T8183XA Persistent postprocedural fistula, initial encounter: Secondary | ICD-10-CM | POA: Diagnosis not present

## 2016-12-04 DIAGNOSIS — Z452 Encounter for adjustment and management of vascular access device: Secondary | ICD-10-CM | POA: Diagnosis not present

## 2016-12-04 DIAGNOSIS — K632 Fistula of intestine: Secondary | ICD-10-CM | POA: Diagnosis not present

## 2016-12-04 DIAGNOSIS — M6281 Muscle weakness (generalized): Secondary | ICD-10-CM | POA: Diagnosis not present

## 2016-12-04 DIAGNOSIS — K651 Peritoneal abscess: Secondary | ICD-10-CM | POA: Diagnosis not present

## 2016-12-08 DIAGNOSIS — T8183XA Persistent postprocedural fistula, initial encounter: Secondary | ICD-10-CM | POA: Diagnosis not present

## 2016-12-08 DIAGNOSIS — Z452 Encounter for adjustment and management of vascular access device: Secondary | ICD-10-CM | POA: Diagnosis not present

## 2016-12-08 DIAGNOSIS — K632 Fistula of intestine: Secondary | ICD-10-CM | POA: Diagnosis not present

## 2016-12-08 DIAGNOSIS — E669 Obesity, unspecified: Secondary | ICD-10-CM | POA: Diagnosis not present

## 2016-12-08 DIAGNOSIS — M6281 Muscle weakness (generalized): Secondary | ICD-10-CM | POA: Diagnosis not present

## 2016-12-08 DIAGNOSIS — K651 Peritoneal abscess: Secondary | ICD-10-CM | POA: Diagnosis not present

## 2016-12-08 DIAGNOSIS — T8143XA Infection following a procedure, organ and space surgical site, initial encounter: Secondary | ICD-10-CM | POA: Diagnosis not present

## 2016-12-08 DIAGNOSIS — K430 Incisional hernia with obstruction, without gangrene: Secondary | ICD-10-CM | POA: Diagnosis not present

## 2016-12-09 DIAGNOSIS — T8183XA Persistent postprocedural fistula, initial encounter: Secondary | ICD-10-CM | POA: Diagnosis not present

## 2016-12-09 DIAGNOSIS — K651 Peritoneal abscess: Secondary | ICD-10-CM | POA: Diagnosis not present

## 2016-12-09 DIAGNOSIS — T8143XA Infection following a procedure, organ and space surgical site, initial encounter: Secondary | ICD-10-CM | POA: Diagnosis not present

## 2016-12-09 DIAGNOSIS — M6281 Muscle weakness (generalized): Secondary | ICD-10-CM | POA: Diagnosis not present

## 2016-12-09 DIAGNOSIS — Z23 Encounter for immunization: Secondary | ICD-10-CM | POA: Diagnosis not present

## 2016-12-09 DIAGNOSIS — T8189XA Other complications of procedures, not elsewhere classified, initial encounter: Secondary | ICD-10-CM | POA: Diagnosis not present

## 2016-12-09 DIAGNOSIS — Z452 Encounter for adjustment and management of vascular access device: Secondary | ICD-10-CM | POA: Diagnosis not present

## 2016-12-09 DIAGNOSIS — Z6830 Body mass index (BMI) 30.0-30.9, adult: Secondary | ICD-10-CM | POA: Diagnosis not present

## 2016-12-09 DIAGNOSIS — R531 Weakness: Secondary | ICD-10-CM | POA: Diagnosis not present

## 2016-12-09 DIAGNOSIS — F329 Major depressive disorder, single episode, unspecified: Secondary | ICD-10-CM | POA: Diagnosis not present

## 2016-12-09 DIAGNOSIS — R109 Unspecified abdominal pain: Secondary | ICD-10-CM | POA: Diagnosis not present

## 2016-12-09 DIAGNOSIS — K632 Fistula of intestine: Secondary | ICD-10-CM | POA: Diagnosis not present

## 2016-12-10 DIAGNOSIS — Z452 Encounter for adjustment and management of vascular access device: Secondary | ICD-10-CM | POA: Diagnosis not present

## 2016-12-10 DIAGNOSIS — K651 Peritoneal abscess: Secondary | ICD-10-CM | POA: Diagnosis not present

## 2016-12-10 DIAGNOSIS — M6281 Muscle weakness (generalized): Secondary | ICD-10-CM | POA: Diagnosis not present

## 2016-12-10 DIAGNOSIS — K632 Fistula of intestine: Secondary | ICD-10-CM | POA: Diagnosis not present

## 2016-12-10 DIAGNOSIS — T8143XA Infection following a procedure, organ and space surgical site, initial encounter: Secondary | ICD-10-CM | POA: Diagnosis not present

## 2016-12-10 DIAGNOSIS — T8183XA Persistent postprocedural fistula, initial encounter: Secondary | ICD-10-CM | POA: Diagnosis not present

## 2016-12-11 DIAGNOSIS — T8143XA Infection following a procedure, organ and space surgical site, initial encounter: Secondary | ICD-10-CM | POA: Diagnosis not present

## 2016-12-11 DIAGNOSIS — Z452 Encounter for adjustment and management of vascular access device: Secondary | ICD-10-CM | POA: Diagnosis not present

## 2016-12-11 DIAGNOSIS — M6281 Muscle weakness (generalized): Secondary | ICD-10-CM | POA: Diagnosis not present

## 2016-12-11 DIAGNOSIS — K651 Peritoneal abscess: Secondary | ICD-10-CM | POA: Diagnosis not present

## 2016-12-11 DIAGNOSIS — T8183XA Persistent postprocedural fistula, initial encounter: Secondary | ICD-10-CM | POA: Diagnosis not present

## 2016-12-11 DIAGNOSIS — K632 Fistula of intestine: Secondary | ICD-10-CM | POA: Diagnosis not present

## 2016-12-14 DIAGNOSIS — K632 Fistula of intestine: Secondary | ICD-10-CM | POA: Diagnosis not present

## 2016-12-14 DIAGNOSIS — Z789 Other specified health status: Secondary | ICD-10-CM | POA: Diagnosis not present

## 2016-12-15 DIAGNOSIS — Z452 Encounter for adjustment and management of vascular access device: Secondary | ICD-10-CM | POA: Diagnosis not present

## 2016-12-15 DIAGNOSIS — M6281 Muscle weakness (generalized): Secondary | ICD-10-CM | POA: Diagnosis not present

## 2016-12-15 DIAGNOSIS — T8183XA Persistent postprocedural fistula, initial encounter: Secondary | ICD-10-CM | POA: Diagnosis not present

## 2016-12-15 DIAGNOSIS — K651 Peritoneal abscess: Secondary | ICD-10-CM | POA: Diagnosis not present

## 2016-12-15 DIAGNOSIS — T8143XA Infection following a procedure, organ and space surgical site, initial encounter: Secondary | ICD-10-CM | POA: Diagnosis not present

## 2016-12-15 DIAGNOSIS — K632 Fistula of intestine: Secondary | ICD-10-CM | POA: Diagnosis not present

## 2016-12-16 DIAGNOSIS — M6281 Muscle weakness (generalized): Secondary | ICD-10-CM | POA: Diagnosis not present

## 2016-12-16 DIAGNOSIS — T8143XA Infection following a procedure, organ and space surgical site, initial encounter: Secondary | ICD-10-CM | POA: Diagnosis not present

## 2016-12-16 DIAGNOSIS — K651 Peritoneal abscess: Secondary | ICD-10-CM | POA: Diagnosis not present

## 2016-12-16 DIAGNOSIS — Z452 Encounter for adjustment and management of vascular access device: Secondary | ICD-10-CM | POA: Diagnosis not present

## 2016-12-16 DIAGNOSIS — T8183XA Persistent postprocedural fistula, initial encounter: Secondary | ICD-10-CM | POA: Diagnosis not present

## 2016-12-16 DIAGNOSIS — K632 Fistula of intestine: Secondary | ICD-10-CM | POA: Diagnosis not present

## 2016-12-19 DIAGNOSIS — M6281 Muscle weakness (generalized): Secondary | ICD-10-CM | POA: Diagnosis not present

## 2016-12-19 DIAGNOSIS — K632 Fistula of intestine: Secondary | ICD-10-CM | POA: Diagnosis not present

## 2016-12-19 DIAGNOSIS — Z452 Encounter for adjustment and management of vascular access device: Secondary | ICD-10-CM | POA: Diagnosis not present

## 2016-12-19 DIAGNOSIS — T8183XA Persistent postprocedural fistula, initial encounter: Secondary | ICD-10-CM | POA: Diagnosis not present

## 2016-12-19 DIAGNOSIS — T8143XA Infection following a procedure, organ and space surgical site, initial encounter: Secondary | ICD-10-CM | POA: Diagnosis not present

## 2016-12-19 DIAGNOSIS — K651 Peritoneal abscess: Secondary | ICD-10-CM | POA: Diagnosis not present

## 2016-12-21 DIAGNOSIS — K632 Fistula of intestine: Secondary | ICD-10-CM | POA: Diagnosis not present

## 2016-12-21 DIAGNOSIS — K651 Peritoneal abscess: Secondary | ICD-10-CM | POA: Diagnosis not present

## 2016-12-21 DIAGNOSIS — K430 Incisional hernia with obstruction, without gangrene: Secondary | ICD-10-CM | POA: Diagnosis not present

## 2016-12-21 DIAGNOSIS — T8183XA Persistent postprocedural fistula, initial encounter: Secondary | ICD-10-CM | POA: Diagnosis not present

## 2016-12-21 DIAGNOSIS — E669 Obesity, unspecified: Secondary | ICD-10-CM | POA: Diagnosis not present

## 2016-12-21 DIAGNOSIS — M6281 Muscle weakness (generalized): Secondary | ICD-10-CM | POA: Diagnosis not present

## 2016-12-21 DIAGNOSIS — Z452 Encounter for adjustment and management of vascular access device: Secondary | ICD-10-CM | POA: Diagnosis not present

## 2016-12-21 DIAGNOSIS — T8143XA Infection following a procedure, organ and space surgical site, initial encounter: Secondary | ICD-10-CM | POA: Diagnosis not present

## 2016-12-23 DIAGNOSIS — E669 Obesity, unspecified: Secondary | ICD-10-CM | POA: Diagnosis not present

## 2016-12-23 DIAGNOSIS — K430 Incisional hernia with obstruction, without gangrene: Secondary | ICD-10-CM | POA: Diagnosis not present

## 2016-12-23 DIAGNOSIS — T8143XA Infection following a procedure, organ and space surgical site, initial encounter: Secondary | ICD-10-CM | POA: Diagnosis not present

## 2016-12-23 DIAGNOSIS — K651 Peritoneal abscess: Secondary | ICD-10-CM | POA: Diagnosis not present

## 2016-12-23 DIAGNOSIS — Z452 Encounter for adjustment and management of vascular access device: Secondary | ICD-10-CM | POA: Diagnosis not present

## 2016-12-23 DIAGNOSIS — M6281 Muscle weakness (generalized): Secondary | ICD-10-CM | POA: Diagnosis not present

## 2016-12-23 DIAGNOSIS — K632 Fistula of intestine: Secondary | ICD-10-CM | POA: Diagnosis not present

## 2016-12-23 DIAGNOSIS — T8183XA Persistent postprocedural fistula, initial encounter: Secondary | ICD-10-CM | POA: Diagnosis not present

## 2016-12-26 DIAGNOSIS — Z79899 Other long term (current) drug therapy: Secondary | ICD-10-CM | POA: Diagnosis not present

## 2016-12-26 DIAGNOSIS — R1084 Generalized abdominal pain: Secondary | ICD-10-CM | POA: Diagnosis not present

## 2016-12-26 DIAGNOSIS — M109 Gout, unspecified: Secondary | ICD-10-CM | POA: Diagnosis not present

## 2016-12-26 DIAGNOSIS — M545 Low back pain: Secondary | ICD-10-CM | POA: Diagnosis not present

## 2016-12-26 DIAGNOSIS — Z9049 Acquired absence of other specified parts of digestive tract: Secondary | ICD-10-CM | POA: Diagnosis not present

## 2016-12-26 DIAGNOSIS — R109 Unspecified abdominal pain: Secondary | ICD-10-CM | POA: Diagnosis not present

## 2016-12-26 DIAGNOSIS — K5900 Constipation, unspecified: Secondary | ICD-10-CM | POA: Diagnosis not present

## 2016-12-26 DIAGNOSIS — I10 Essential (primary) hypertension: Secondary | ICD-10-CM | POA: Diagnosis not present

## 2016-12-26 DIAGNOSIS — L02211 Cutaneous abscess of abdominal wall: Secondary | ICD-10-CM | POA: Diagnosis not present

## 2016-12-26 DIAGNOSIS — M1990 Unspecified osteoarthritis, unspecified site: Secondary | ICD-10-CM | POA: Diagnosis not present

## 2016-12-26 DIAGNOSIS — Z87891 Personal history of nicotine dependence: Secondary | ICD-10-CM | POA: Diagnosis not present

## 2016-12-28 DIAGNOSIS — M6281 Muscle weakness (generalized): Secondary | ICD-10-CM | POA: Diagnosis not present

## 2016-12-28 DIAGNOSIS — E669 Obesity, unspecified: Secondary | ICD-10-CM | POA: Diagnosis not present

## 2016-12-28 DIAGNOSIS — K632 Fistula of intestine: Secondary | ICD-10-CM | POA: Diagnosis not present

## 2016-12-28 DIAGNOSIS — K651 Peritoneal abscess: Secondary | ICD-10-CM | POA: Diagnosis not present

## 2016-12-28 DIAGNOSIS — Z452 Encounter for adjustment and management of vascular access device: Secondary | ICD-10-CM | POA: Diagnosis not present

## 2016-12-28 DIAGNOSIS — T8143XA Infection following a procedure, organ and space surgical site, initial encounter: Secondary | ICD-10-CM | POA: Diagnosis not present

## 2016-12-28 DIAGNOSIS — K430 Incisional hernia with obstruction, without gangrene: Secondary | ICD-10-CM | POA: Diagnosis not present

## 2016-12-28 DIAGNOSIS — T8183XA Persistent postprocedural fistula, initial encounter: Secondary | ICD-10-CM | POA: Diagnosis not present

## 2016-12-30 DIAGNOSIS — T8143XA Infection following a procedure, organ and space surgical site, initial encounter: Secondary | ICD-10-CM | POA: Diagnosis not present

## 2016-12-30 DIAGNOSIS — Z452 Encounter for adjustment and management of vascular access device: Secondary | ICD-10-CM | POA: Diagnosis not present

## 2016-12-30 DIAGNOSIS — T8183XA Persistent postprocedural fistula, initial encounter: Secondary | ICD-10-CM | POA: Diagnosis not present

## 2016-12-30 DIAGNOSIS — K651 Peritoneal abscess: Secondary | ICD-10-CM | POA: Diagnosis not present

## 2016-12-30 DIAGNOSIS — K632 Fistula of intestine: Secondary | ICD-10-CM | POA: Diagnosis not present

## 2016-12-30 DIAGNOSIS — M6281 Muscle weakness (generalized): Secondary | ICD-10-CM | POA: Diagnosis not present

## 2017-01-05 DIAGNOSIS — T8143XA Infection following a procedure, organ and space surgical site, initial encounter: Secondary | ICD-10-CM | POA: Diagnosis not present

## 2017-01-05 DIAGNOSIS — T8183XA Persistent postprocedural fistula, initial encounter: Secondary | ICD-10-CM | POA: Diagnosis not present

## 2017-01-05 DIAGNOSIS — K632 Fistula of intestine: Secondary | ICD-10-CM | POA: Diagnosis not present

## 2017-01-05 DIAGNOSIS — K651 Peritoneal abscess: Secondary | ICD-10-CM | POA: Diagnosis not present

## 2017-01-05 DIAGNOSIS — Z452 Encounter for adjustment and management of vascular access device: Secondary | ICD-10-CM | POA: Diagnosis not present

## 2017-01-05 DIAGNOSIS — M6281 Muscle weakness (generalized): Secondary | ICD-10-CM | POA: Diagnosis not present

## 2017-01-11 DIAGNOSIS — T8183XA Persistent postprocedural fistula, initial encounter: Secondary | ICD-10-CM | POA: Diagnosis not present

## 2017-01-11 DIAGNOSIS — T8189XA Other complications of procedures, not elsewhere classified, initial encounter: Secondary | ICD-10-CM | POA: Diagnosis not present

## 2017-01-11 DIAGNOSIS — R109 Unspecified abdominal pain: Secondary | ICD-10-CM | POA: Diagnosis not present

## 2017-01-11 DIAGNOSIS — M6281 Muscle weakness (generalized): Secondary | ICD-10-CM | POA: Diagnosis not present

## 2017-01-11 DIAGNOSIS — K632 Fistula of intestine: Secondary | ICD-10-CM | POA: Diagnosis not present

## 2017-01-11 DIAGNOSIS — K651 Peritoneal abscess: Secondary | ICD-10-CM | POA: Diagnosis not present

## 2017-01-11 DIAGNOSIS — T8143XA Infection following a procedure, organ and space surgical site, initial encounter: Secondary | ICD-10-CM | POA: Diagnosis not present

## 2017-01-11 DIAGNOSIS — Z6832 Body mass index (BMI) 32.0-32.9, adult: Secondary | ICD-10-CM | POA: Diagnosis not present

## 2017-01-11 DIAGNOSIS — Z8719 Personal history of other diseases of the digestive system: Secondary | ICD-10-CM | POA: Diagnosis not present

## 2017-01-11 DIAGNOSIS — Z452 Encounter for adjustment and management of vascular access device: Secondary | ICD-10-CM | POA: Diagnosis not present

## 2017-01-11 DIAGNOSIS — E669 Obesity, unspecified: Secondary | ICD-10-CM | POA: Diagnosis not present

## 2017-01-11 DIAGNOSIS — I10 Essential (primary) hypertension: Secondary | ICD-10-CM | POA: Diagnosis not present

## 2017-01-14 DIAGNOSIS — R109 Unspecified abdominal pain: Secondary | ICD-10-CM | POA: Diagnosis not present

## 2017-01-14 DIAGNOSIS — K651 Peritoneal abscess: Secondary | ICD-10-CM | POA: Diagnosis not present

## 2017-01-14 DIAGNOSIS — Z9889 Other specified postprocedural states: Secondary | ICD-10-CM | POA: Diagnosis not present

## 2017-01-14 DIAGNOSIS — K632 Fistula of intestine: Secondary | ICD-10-CM | POA: Diagnosis not present

## 2017-01-17 DIAGNOSIS — K632 Fistula of intestine: Secondary | ICD-10-CM | POA: Insufficient documentation

## 2017-01-28 DIAGNOSIS — Z09 Encounter for follow-up examination after completed treatment for conditions other than malignant neoplasm: Secondary | ICD-10-CM | POA: Diagnosis not present

## 2017-01-28 DIAGNOSIS — L988 Other specified disorders of the skin and subcutaneous tissue: Secondary | ICD-10-CM | POA: Diagnosis not present

## 2017-02-11 DIAGNOSIS — R109 Unspecified abdominal pain: Secondary | ICD-10-CM | POA: Diagnosis not present

## 2017-02-11 DIAGNOSIS — T8189XA Other complications of procedures, not elsewhere classified, initial encounter: Secondary | ICD-10-CM | POA: Diagnosis not present

## 2017-02-11 DIAGNOSIS — F329 Major depressive disorder, single episode, unspecified: Secondary | ICD-10-CM | POA: Diagnosis not present

## 2017-02-11 DIAGNOSIS — K632 Fistula of intestine: Secondary | ICD-10-CM | POA: Diagnosis not present

## 2017-02-11 DIAGNOSIS — R42 Dizziness and giddiness: Secondary | ICD-10-CM | POA: Diagnosis not present

## 2017-02-11 DIAGNOSIS — Z6832 Body mass index (BMI) 32.0-32.9, adult: Secondary | ICD-10-CM | POA: Diagnosis not present

## 2017-02-23 DIAGNOSIS — K439 Ventral hernia without obstruction or gangrene: Secondary | ICD-10-CM | POA: Diagnosis not present

## 2017-02-23 DIAGNOSIS — K632 Fistula of intestine: Secondary | ICD-10-CM | POA: Diagnosis not present

## 2017-03-15 DIAGNOSIS — R109 Unspecified abdominal pain: Secondary | ICD-10-CM | POA: Diagnosis not present

## 2017-03-15 DIAGNOSIS — M1710 Unilateral primary osteoarthritis, unspecified knee: Secondary | ICD-10-CM | POA: Diagnosis not present

## 2017-03-15 DIAGNOSIS — K632 Fistula of intestine: Secondary | ICD-10-CM | POA: Diagnosis not present

## 2017-03-15 DIAGNOSIS — I10 Essential (primary) hypertension: Secondary | ICD-10-CM | POA: Diagnosis not present

## 2017-03-15 DIAGNOSIS — B353 Tinea pedis: Secondary | ICD-10-CM | POA: Diagnosis not present

## 2017-04-05 DIAGNOSIS — L988 Other specified disorders of the skin and subcutaneous tissue: Secondary | ICD-10-CM | POA: Diagnosis not present

## 2017-04-05 DIAGNOSIS — Z8719 Personal history of other diseases of the digestive system: Secondary | ICD-10-CM | POA: Diagnosis not present

## 2017-04-05 DIAGNOSIS — K439 Ventral hernia without obstruction or gangrene: Secondary | ICD-10-CM | POA: Diagnosis not present

## 2017-04-05 DIAGNOSIS — Z9889 Other specified postprocedural states: Secondary | ICD-10-CM | POA: Diagnosis not present

## 2017-04-05 DIAGNOSIS — K632 Fistula of intestine: Secondary | ICD-10-CM | POA: Diagnosis not present

## 2017-05-10 DIAGNOSIS — K439 Ventral hernia without obstruction or gangrene: Secondary | ICD-10-CM | POA: Diagnosis not present

## 2017-05-13 DIAGNOSIS — B353 Tinea pedis: Secondary | ICD-10-CM | POA: Diagnosis not present

## 2017-05-13 DIAGNOSIS — I10 Essential (primary) hypertension: Secondary | ICD-10-CM | POA: Diagnosis not present

## 2017-05-13 DIAGNOSIS — R109 Unspecified abdominal pain: Secondary | ICD-10-CM | POA: Diagnosis not present

## 2017-05-13 DIAGNOSIS — Z6833 Body mass index (BMI) 33.0-33.9, adult: Secondary | ICD-10-CM | POA: Diagnosis not present

## 2017-05-13 DIAGNOSIS — F329 Major depressive disorder, single episode, unspecified: Secondary | ICD-10-CM | POA: Diagnosis not present

## 2017-05-13 DIAGNOSIS — E7800 Pure hypercholesterolemia, unspecified: Secondary | ICD-10-CM | POA: Diagnosis not present

## 2017-05-13 DIAGNOSIS — M1710 Unilateral primary osteoarthritis, unspecified knee: Secondary | ICD-10-CM | POA: Diagnosis not present

## 2017-05-13 DIAGNOSIS — Z79899 Other long term (current) drug therapy: Secondary | ICD-10-CM | POA: Diagnosis not present

## 2017-08-20 DIAGNOSIS — M5134 Other intervertebral disc degeneration, thoracic region: Secondary | ICD-10-CM | POA: Diagnosis not present

## 2017-08-20 DIAGNOSIS — R93422 Abnormal radiologic findings on diagnostic imaging of left kidney: Secondary | ICD-10-CM | POA: Diagnosis not present

## 2017-08-20 DIAGNOSIS — Z6833 Body mass index (BMI) 33.0-33.9, adult: Secondary | ICD-10-CM | POA: Diagnosis not present

## 2017-08-20 DIAGNOSIS — R937 Abnormal findings on diagnostic imaging of other parts of musculoskeletal system: Secondary | ICD-10-CM | POA: Diagnosis not present

## 2017-08-20 DIAGNOSIS — I10 Essential (primary) hypertension: Secondary | ICD-10-CM | POA: Diagnosis not present

## 2017-08-20 DIAGNOSIS — K449 Diaphragmatic hernia without obstruction or gangrene: Secondary | ICD-10-CM | POA: Diagnosis not present

## 2017-08-20 DIAGNOSIS — L02211 Cutaneous abscess of abdominal wall: Secondary | ICD-10-CM | POA: Diagnosis not present

## 2017-08-20 DIAGNOSIS — M5136 Other intervertebral disc degeneration, lumbar region: Secondary | ICD-10-CM | POA: Diagnosis not present

## 2017-08-20 DIAGNOSIS — K632 Fistula of intestine: Secondary | ICD-10-CM | POA: Diagnosis not present

## 2017-08-20 DIAGNOSIS — I700 Atherosclerosis of aorta: Secondary | ICD-10-CM | POA: Diagnosis not present

## 2017-08-20 DIAGNOSIS — K439 Ventral hernia without obstruction or gangrene: Secondary | ICD-10-CM | POA: Diagnosis not present

## 2017-08-24 DIAGNOSIS — K651 Peritoneal abscess: Secondary | ICD-10-CM | POA: Diagnosis not present

## 2017-08-25 DIAGNOSIS — R188 Other ascites: Secondary | ICD-10-CM | POA: Diagnosis not present

## 2017-08-25 DIAGNOSIS — L0291 Cutaneous abscess, unspecified: Secondary | ICD-10-CM | POA: Diagnosis not present

## 2017-08-25 DIAGNOSIS — K632 Fistula of intestine: Secondary | ICD-10-CM | POA: Diagnosis not present

## 2017-09-10 DIAGNOSIS — R1031 Right lower quadrant pain: Secondary | ICD-10-CM | POA: Diagnosis not present

## 2017-09-10 DIAGNOSIS — L02211 Cutaneous abscess of abdominal wall: Secondary | ICD-10-CM | POA: Diagnosis not present

## 2017-09-10 DIAGNOSIS — T859XXA Unspecified complication of internal prosthetic device, implant and graft, initial encounter: Secondary | ICD-10-CM | POA: Diagnosis not present

## 2017-09-10 DIAGNOSIS — F329 Major depressive disorder, single episode, unspecified: Secondary | ICD-10-CM | POA: Diagnosis not present

## 2017-09-10 DIAGNOSIS — R0602 Shortness of breath: Secondary | ICD-10-CM | POA: Diagnosis not present

## 2017-09-10 DIAGNOSIS — Z87891 Personal history of nicotine dependence: Secondary | ICD-10-CM | POA: Diagnosis not present

## 2017-09-10 DIAGNOSIS — T85698A Other mechanical complication of other specified internal prosthetic devices, implants and grafts, initial encounter: Secondary | ICD-10-CM | POA: Diagnosis not present

## 2017-09-10 DIAGNOSIS — I10 Essential (primary) hypertension: Secondary | ICD-10-CM | POA: Diagnosis not present

## 2017-09-10 DIAGNOSIS — K632 Fistula of intestine: Secondary | ICD-10-CM | POA: Diagnosis not present

## 2017-09-10 DIAGNOSIS — Z79899 Other long term (current) drug therapy: Secondary | ICD-10-CM | POA: Diagnosis not present

## 2017-09-13 DIAGNOSIS — N2889 Other specified disorders of kidney and ureter: Secondary | ICD-10-CM | POA: Diagnosis not present

## 2017-09-17 ENCOUNTER — Other Ambulatory Visit: Payer: Self-pay

## 2017-09-21 DIAGNOSIS — N3289 Other specified disorders of bladder: Secondary | ICD-10-CM | POA: Diagnosis not present

## 2017-09-21 DIAGNOSIS — N281 Cyst of kidney, acquired: Secondary | ICD-10-CM | POA: Diagnosis not present

## 2017-09-21 DIAGNOSIS — K439 Ventral hernia without obstruction or gangrene: Secondary | ICD-10-CM | POA: Diagnosis not present

## 2017-09-21 DIAGNOSIS — N2889 Other specified disorders of kidney and ureter: Secondary | ICD-10-CM | POA: Diagnosis not present

## 2017-09-28 DIAGNOSIS — R109 Unspecified abdominal pain: Secondary | ICD-10-CM | POA: Diagnosis not present

## 2017-09-28 DIAGNOSIS — K632 Fistula of intestine: Secondary | ICD-10-CM | POA: Diagnosis not present

## 2017-09-28 DIAGNOSIS — I10 Essential (primary) hypertension: Secondary | ICD-10-CM | POA: Diagnosis not present

## 2017-09-28 DIAGNOSIS — F329 Major depressive disorder, single episode, unspecified: Secondary | ICD-10-CM | POA: Diagnosis not present

## 2017-10-19 DIAGNOSIS — E669 Obesity, unspecified: Secondary | ICD-10-CM | POA: Diagnosis not present

## 2017-10-28 DIAGNOSIS — R1031 Right lower quadrant pain: Secondary | ICD-10-CM | POA: Diagnosis not present

## 2017-10-28 DIAGNOSIS — Z87891 Personal history of nicotine dependence: Secondary | ICD-10-CM | POA: Diagnosis not present

## 2017-10-28 DIAGNOSIS — K439 Ventral hernia without obstruction or gangrene: Secondary | ICD-10-CM | POA: Diagnosis not present

## 2017-10-28 DIAGNOSIS — L539 Erythematous condition, unspecified: Secondary | ICD-10-CM | POA: Diagnosis not present

## 2017-10-28 DIAGNOSIS — L02211 Cutaneous abscess of abdominal wall: Secondary | ICD-10-CM | POA: Diagnosis not present

## 2017-10-28 DIAGNOSIS — J9811 Atelectasis: Secondary | ICD-10-CM | POA: Diagnosis not present

## 2017-10-28 DIAGNOSIS — S31109A Unspecified open wound of abdominal wall, unspecified quadrant without penetration into peritoneal cavity, initial encounter: Secondary | ICD-10-CM | POA: Diagnosis not present

## 2017-10-28 DIAGNOSIS — D72829 Elevated white blood cell count, unspecified: Secondary | ICD-10-CM | POA: Diagnosis not present

## 2017-10-28 DIAGNOSIS — I10 Essential (primary) hypertension: Secondary | ICD-10-CM | POA: Diagnosis not present

## 2017-10-28 DIAGNOSIS — N281 Cyst of kidney, acquired: Secondary | ICD-10-CM | POA: Diagnosis not present

## 2017-10-28 DIAGNOSIS — K316 Fistula of stomach and duodenum: Secondary | ICD-10-CM | POA: Diagnosis not present

## 2017-10-28 DIAGNOSIS — A419 Sepsis, unspecified organism: Secondary | ICD-10-CM | POA: Diagnosis not present

## 2017-10-28 DIAGNOSIS — L03311 Cellulitis of abdominal wall: Secondary | ICD-10-CM | POA: Diagnosis not present

## 2017-10-29 DIAGNOSIS — Z87891 Personal history of nicotine dependence: Secondary | ICD-10-CM | POA: Diagnosis not present

## 2017-10-29 DIAGNOSIS — Z87311 Personal history of (healed) other pathological fracture: Secondary | ICD-10-CM | POA: Diagnosis not present

## 2017-10-29 DIAGNOSIS — M19021 Primary osteoarthritis, right elbow: Secondary | ICD-10-CM | POA: Diagnosis present

## 2017-10-29 DIAGNOSIS — T8140XA Infection following a procedure, unspecified, initial encounter: Secondary | ICD-10-CM | POA: Diagnosis present

## 2017-10-29 DIAGNOSIS — T8189XA Other complications of procedures, not elsewhere classified, initial encounter: Secondary | ICD-10-CM | POA: Diagnosis not present

## 2017-10-29 DIAGNOSIS — Z1624 Resistance to multiple antibiotics: Secondary | ICD-10-CM | POA: Diagnosis not present

## 2017-10-29 DIAGNOSIS — Z1635 Resistance to multiple antimicrobial drugs: Secondary | ICD-10-CM | POA: Diagnosis not present

## 2017-10-29 DIAGNOSIS — M25521 Pain in right elbow: Secondary | ICD-10-CM | POA: Diagnosis not present

## 2017-10-29 DIAGNOSIS — S31603A Unspecified open wound of abdominal wall, right lower quadrant with penetration into peritoneal cavity, initial encounter: Secondary | ICD-10-CM | POA: Diagnosis not present

## 2017-10-29 DIAGNOSIS — K632 Fistula of intestine: Secondary | ICD-10-CM | POA: Diagnosis present

## 2017-10-29 DIAGNOSIS — K651 Peritoneal abscess: Secondary | ICD-10-CM | POA: Diagnosis not present

## 2017-10-29 DIAGNOSIS — I10 Essential (primary) hypertension: Secondary | ICD-10-CM | POA: Diagnosis present

## 2017-10-29 DIAGNOSIS — Z8739 Personal history of other diseases of the musculoskeletal system and connective tissue: Secondary | ICD-10-CM | POA: Diagnosis not present

## 2017-10-29 DIAGNOSIS — K316 Fistula of stomach and duodenum: Secondary | ICD-10-CM | POA: Diagnosis not present

## 2017-10-29 DIAGNOSIS — R509 Fever, unspecified: Secondary | ICD-10-CM | POA: Diagnosis not present

## 2017-10-29 DIAGNOSIS — M10021 Idiopathic gout, right elbow: Secondary | ICD-10-CM | POA: Diagnosis present

## 2017-10-29 DIAGNOSIS — B961 Klebsiella pneumoniae [K. pneumoniae] as the cause of diseases classified elsewhere: Secondary | ICD-10-CM | POA: Diagnosis present

## 2017-10-29 DIAGNOSIS — M1990 Unspecified osteoarthritis, unspecified site: Secondary | ICD-10-CM | POA: Diagnosis present

## 2017-10-29 DIAGNOSIS — L539 Erythematous condition, unspecified: Secondary | ICD-10-CM | POA: Diagnosis not present

## 2017-10-29 DIAGNOSIS — B9620 Unspecified Escherichia coli [E. coli] as the cause of diseases classified elsewhere: Secondary | ICD-10-CM | POA: Diagnosis present

## 2017-10-29 DIAGNOSIS — R188 Other ascites: Secondary | ICD-10-CM | POA: Diagnosis not present

## 2017-10-29 DIAGNOSIS — L02211 Cutaneous abscess of abdominal wall: Secondary | ICD-10-CM | POA: Diagnosis present

## 2017-10-29 DIAGNOSIS — Z9889 Other specified postprocedural states: Secondary | ICD-10-CM | POA: Diagnosis not present

## 2017-10-29 DIAGNOSIS — R1031 Right lower quadrant pain: Secondary | ICD-10-CM | POA: Diagnosis not present

## 2017-10-29 DIAGNOSIS — T8183XA Persistent postprocedural fistula, initial encounter: Secondary | ICD-10-CM | POA: Diagnosis present

## 2017-10-29 DIAGNOSIS — B9629 Other Escherichia coli [E. coli] as the cause of diseases classified elsewhere: Secondary | ICD-10-CM | POA: Diagnosis not present

## 2017-10-29 DIAGNOSIS — R109 Unspecified abdominal pain: Secondary | ICD-10-CM | POA: Diagnosis not present

## 2017-11-03 DIAGNOSIS — T8143XA Infection following a procedure, organ and space surgical site, initial encounter: Secondary | ICD-10-CM | POA: Diagnosis not present

## 2017-11-03 DIAGNOSIS — K632 Fistula of intestine: Secondary | ICD-10-CM | POA: Diagnosis not present

## 2017-11-03 DIAGNOSIS — T8183XA Persistent postprocedural fistula, initial encounter: Secondary | ICD-10-CM | POA: Diagnosis not present

## 2017-11-03 DIAGNOSIS — B961 Klebsiella pneumoniae [K. pneumoniae] as the cause of diseases classified elsewhere: Secondary | ICD-10-CM | POA: Diagnosis not present

## 2017-11-03 DIAGNOSIS — B9620 Unspecified Escherichia coli [E. coli] as the cause of diseases classified elsewhere: Secondary | ICD-10-CM | POA: Diagnosis not present

## 2017-11-03 DIAGNOSIS — L02211 Cutaneous abscess of abdominal wall: Secondary | ICD-10-CM | POA: Diagnosis not present

## 2017-11-09 DIAGNOSIS — T8143XA Infection following a procedure, organ and space surgical site, initial encounter: Secondary | ICD-10-CM | POA: Diagnosis not present

## 2017-11-09 DIAGNOSIS — E669 Obesity, unspecified: Secondary | ICD-10-CM | POA: Diagnosis not present

## 2017-11-09 DIAGNOSIS — B961 Klebsiella pneumoniae [K. pneumoniae] as the cause of diseases classified elsewhere: Secondary | ICD-10-CM | POA: Diagnosis not present

## 2017-11-09 DIAGNOSIS — Z1624 Resistance to multiple antibiotics: Secondary | ICD-10-CM | POA: Diagnosis not present

## 2017-11-09 DIAGNOSIS — L02211 Cutaneous abscess of abdominal wall: Secondary | ICD-10-CM | POA: Diagnosis not present

## 2017-11-09 DIAGNOSIS — Z23 Encounter for immunization: Secondary | ICD-10-CM | POA: Diagnosis not present

## 2017-11-09 DIAGNOSIS — Z9181 History of falling: Secondary | ICD-10-CM | POA: Diagnosis not present

## 2017-11-09 DIAGNOSIS — B9620 Unspecified Escherichia coli [E. coli] as the cause of diseases classified elsewhere: Secondary | ICD-10-CM | POA: Diagnosis not present

## 2017-11-09 DIAGNOSIS — M25521 Pain in right elbow: Secondary | ICD-10-CM | POA: Diagnosis not present

## 2017-11-09 DIAGNOSIS — K430 Incisional hernia with obstruction, without gangrene: Secondary | ICD-10-CM | POA: Diagnosis not present

## 2017-11-09 DIAGNOSIS — T8183XA Persistent postprocedural fistula, initial encounter: Secondary | ICD-10-CM | POA: Diagnosis not present

## 2017-11-09 DIAGNOSIS — R109 Unspecified abdominal pain: Secondary | ICD-10-CM | POA: Diagnosis not present

## 2017-11-09 DIAGNOSIS — K632 Fistula of intestine: Secondary | ICD-10-CM | POA: Diagnosis not present

## 2017-11-15 DIAGNOSIS — T8143XA Infection following a procedure, organ and space surgical site, initial encounter: Secondary | ICD-10-CM | POA: Diagnosis not present

## 2017-11-15 DIAGNOSIS — L02211 Cutaneous abscess of abdominal wall: Secondary | ICD-10-CM | POA: Diagnosis not present

## 2017-11-15 DIAGNOSIS — B961 Klebsiella pneumoniae [K. pneumoniae] as the cause of diseases classified elsewhere: Secondary | ICD-10-CM | POA: Diagnosis not present

## 2017-11-15 DIAGNOSIS — K632 Fistula of intestine: Secondary | ICD-10-CM | POA: Diagnosis not present

## 2017-11-15 DIAGNOSIS — B9620 Unspecified Escherichia coli [E. coli] as the cause of diseases classified elsewhere: Secondary | ICD-10-CM | POA: Diagnosis not present

## 2017-11-15 DIAGNOSIS — T8183XA Persistent postprocedural fistula, initial encounter: Secondary | ICD-10-CM | POA: Diagnosis not present

## 2017-11-15 DIAGNOSIS — E669 Obesity, unspecified: Secondary | ICD-10-CM | POA: Diagnosis not present

## 2017-11-15 DIAGNOSIS — K430 Incisional hernia with obstruction, without gangrene: Secondary | ICD-10-CM | POA: Diagnosis not present

## 2017-11-16 DIAGNOSIS — K632 Fistula of intestine: Secondary | ICD-10-CM | POA: Diagnosis not present

## 2017-11-17 DIAGNOSIS — Z4682 Encounter for fitting and adjustment of non-vascular catheter: Secondary | ICD-10-CM | POA: Diagnosis not present

## 2017-11-17 DIAGNOSIS — Z452 Encounter for adjustment and management of vascular access device: Secondary | ICD-10-CM | POA: Diagnosis not present

## 2017-11-17 DIAGNOSIS — R1030 Lower abdominal pain, unspecified: Secondary | ICD-10-CM | POA: Diagnosis not present

## 2017-11-23 DIAGNOSIS — E669 Obesity, unspecified: Secondary | ICD-10-CM | POA: Diagnosis not present

## 2017-11-23 DIAGNOSIS — L02211 Cutaneous abscess of abdominal wall: Secondary | ICD-10-CM | POA: Diagnosis not present

## 2017-11-23 DIAGNOSIS — K430 Incisional hernia with obstruction, without gangrene: Secondary | ICD-10-CM | POA: Diagnosis not present

## 2017-11-23 DIAGNOSIS — K632 Fistula of intestine: Secondary | ICD-10-CM | POA: Diagnosis not present

## 2017-11-23 DIAGNOSIS — T8143XA Infection following a procedure, organ and space surgical site, initial encounter: Secondary | ICD-10-CM | POA: Diagnosis not present

## 2017-11-23 DIAGNOSIS — B9620 Unspecified Escherichia coli [E. coli] as the cause of diseases classified elsewhere: Secondary | ICD-10-CM | POA: Diagnosis not present

## 2017-11-23 DIAGNOSIS — T8183XA Persistent postprocedural fistula, initial encounter: Secondary | ICD-10-CM | POA: Diagnosis not present

## 2017-11-23 DIAGNOSIS — B961 Klebsiella pneumoniae [K. pneumoniae] as the cause of diseases classified elsewhere: Secondary | ICD-10-CM | POA: Diagnosis not present

## 2017-11-26 DIAGNOSIS — R935 Abnormal findings on diagnostic imaging of other abdominal regions, including retroperitoneum: Secondary | ICD-10-CM | POA: Diagnosis not present

## 2017-11-26 DIAGNOSIS — K439 Ventral hernia without obstruction or gangrene: Secondary | ICD-10-CM | POA: Diagnosis not present

## 2017-11-26 DIAGNOSIS — L02211 Cutaneous abscess of abdominal wall: Secondary | ICD-10-CM | POA: Diagnosis not present

## 2017-11-26 DIAGNOSIS — K651 Peritoneal abscess: Secondary | ICD-10-CM | POA: Diagnosis not present

## 2017-11-26 DIAGNOSIS — K632 Fistula of intestine: Secondary | ICD-10-CM | POA: Diagnosis not present

## 2017-11-29 DIAGNOSIS — B961 Klebsiella pneumoniae [K. pneumoniae] as the cause of diseases classified elsewhere: Secondary | ICD-10-CM | POA: Diagnosis not present

## 2017-11-29 DIAGNOSIS — L02211 Cutaneous abscess of abdominal wall: Secondary | ICD-10-CM | POA: Diagnosis not present

## 2017-11-29 DIAGNOSIS — B9620 Unspecified Escherichia coli [E. coli] as the cause of diseases classified elsewhere: Secondary | ICD-10-CM | POA: Diagnosis not present

## 2017-11-29 DIAGNOSIS — Z1635 Resistance to multiple antimicrobial drugs: Secondary | ICD-10-CM | POA: Diagnosis not present

## 2017-11-29 DIAGNOSIS — L538 Other specified erythematous conditions: Secondary | ICD-10-CM | POA: Diagnosis not present

## 2017-11-29 DIAGNOSIS — M25521 Pain in right elbow: Secondary | ICD-10-CM | POA: Diagnosis not present

## 2017-12-01 DIAGNOSIS — K632 Fistula of intestine: Secondary | ICD-10-CM | POA: Diagnosis not present

## 2017-12-01 DIAGNOSIS — K430 Incisional hernia with obstruction, without gangrene: Secondary | ICD-10-CM | POA: Diagnosis not present

## 2017-12-01 DIAGNOSIS — T8143XA Infection following a procedure, organ and space surgical site, initial encounter: Secondary | ICD-10-CM | POA: Diagnosis not present

## 2017-12-01 DIAGNOSIS — L02211 Cutaneous abscess of abdominal wall: Secondary | ICD-10-CM | POA: Diagnosis not present

## 2017-12-01 DIAGNOSIS — B961 Klebsiella pneumoniae [K. pneumoniae] as the cause of diseases classified elsewhere: Secondary | ICD-10-CM | POA: Diagnosis not present

## 2017-12-01 DIAGNOSIS — B9620 Unspecified Escherichia coli [E. coli] as the cause of diseases classified elsewhere: Secondary | ICD-10-CM | POA: Diagnosis not present

## 2017-12-01 DIAGNOSIS — T8183XA Persistent postprocedural fistula, initial encounter: Secondary | ICD-10-CM | POA: Diagnosis not present

## 2017-12-01 DIAGNOSIS — E669 Obesity, unspecified: Secondary | ICD-10-CM | POA: Diagnosis not present

## 2017-12-03 DIAGNOSIS — Z87891 Personal history of nicotine dependence: Secondary | ICD-10-CM | POA: Diagnosis not present

## 2017-12-03 DIAGNOSIS — I10 Essential (primary) hypertension: Secondary | ICD-10-CM | POA: Diagnosis not present

## 2017-12-03 DIAGNOSIS — T85898A Other specified complication of other internal prosthetic devices, implants and grafts, initial encounter: Secondary | ICD-10-CM | POA: Diagnosis not present

## 2017-12-03 DIAGNOSIS — Z8249 Family history of ischemic heart disease and other diseases of the circulatory system: Secondary | ICD-10-CM | POA: Diagnosis not present

## 2017-12-03 DIAGNOSIS — Z809 Family history of malignant neoplasm, unspecified: Secondary | ICD-10-CM | POA: Diagnosis not present

## 2017-12-03 DIAGNOSIS — L02211 Cutaneous abscess of abdominal wall: Secondary | ICD-10-CM | POA: Diagnosis not present

## 2017-12-03 DIAGNOSIS — Z4803 Encounter for change or removal of drains: Secondary | ICD-10-CM | POA: Diagnosis not present

## 2017-12-03 DIAGNOSIS — Z833 Family history of diabetes mellitus: Secondary | ICD-10-CM | POA: Diagnosis not present

## 2017-12-03 DIAGNOSIS — T85698A Other mechanical complication of other specified internal prosthetic devices, implants and grafts, initial encounter: Secondary | ICD-10-CM | POA: Diagnosis not present

## 2017-12-04 DIAGNOSIS — Z4803 Encounter for change or removal of drains: Secondary | ICD-10-CM | POA: Diagnosis not present

## 2017-12-07 DIAGNOSIS — T8143XA Infection following a procedure, organ and space surgical site, initial encounter: Secondary | ICD-10-CM | POA: Diagnosis not present

## 2017-12-07 DIAGNOSIS — B9620 Unspecified Escherichia coli [E. coli] as the cause of diseases classified elsewhere: Secondary | ICD-10-CM | POA: Diagnosis not present

## 2017-12-07 DIAGNOSIS — B961 Klebsiella pneumoniae [K. pneumoniae] as the cause of diseases classified elsewhere: Secondary | ICD-10-CM | POA: Diagnosis not present

## 2017-12-07 DIAGNOSIS — K430 Incisional hernia with obstruction, without gangrene: Secondary | ICD-10-CM | POA: Diagnosis not present

## 2017-12-07 DIAGNOSIS — T8183XA Persistent postprocedural fistula, initial encounter: Secondary | ICD-10-CM | POA: Diagnosis not present

## 2017-12-07 DIAGNOSIS — K632 Fistula of intestine: Secondary | ICD-10-CM | POA: Diagnosis not present

## 2017-12-07 DIAGNOSIS — L02211 Cutaneous abscess of abdominal wall: Secondary | ICD-10-CM | POA: Diagnosis not present

## 2017-12-07 DIAGNOSIS — E669 Obesity, unspecified: Secondary | ICD-10-CM | POA: Diagnosis not present

## 2017-12-09 DIAGNOSIS — I10 Essential (primary) hypertension: Secondary | ICD-10-CM | POA: Diagnosis not present

## 2017-12-09 DIAGNOSIS — Z79899 Other long term (current) drug therapy: Secondary | ICD-10-CM | POA: Diagnosis not present

## 2017-12-09 DIAGNOSIS — K632 Fistula of intestine: Secondary | ICD-10-CM | POA: Diagnosis not present

## 2017-12-09 DIAGNOSIS — Z1624 Resistance to multiple antibiotics: Secondary | ICD-10-CM | POA: Diagnosis not present

## 2017-12-09 DIAGNOSIS — N2889 Other specified disorders of kidney and ureter: Secondary | ICD-10-CM | POA: Diagnosis not present

## 2017-12-13 DIAGNOSIS — K632 Fistula of intestine: Secondary | ICD-10-CM | POA: Diagnosis not present

## 2017-12-13 DIAGNOSIS — B9620 Unspecified Escherichia coli [E. coli] as the cause of diseases classified elsewhere: Secondary | ICD-10-CM | POA: Diagnosis not present

## 2017-12-13 DIAGNOSIS — T8183XA Persistent postprocedural fistula, initial encounter: Secondary | ICD-10-CM | POA: Diagnosis not present

## 2017-12-13 DIAGNOSIS — T8143XA Infection following a procedure, organ and space surgical site, initial encounter: Secondary | ICD-10-CM | POA: Diagnosis not present

## 2017-12-13 DIAGNOSIS — B961 Klebsiella pneumoniae [K. pneumoniae] as the cause of diseases classified elsewhere: Secondary | ICD-10-CM | POA: Diagnosis not present

## 2017-12-13 DIAGNOSIS — K430 Incisional hernia with obstruction, without gangrene: Secondary | ICD-10-CM | POA: Diagnosis not present

## 2017-12-13 DIAGNOSIS — E669 Obesity, unspecified: Secondary | ICD-10-CM | POA: Diagnosis not present

## 2017-12-13 DIAGNOSIS — L02211 Cutaneous abscess of abdominal wall: Secondary | ICD-10-CM | POA: Diagnosis not present

## 2017-12-17 DIAGNOSIS — Z4803 Encounter for change or removal of drains: Secondary | ICD-10-CM | POA: Diagnosis not present

## 2017-12-17 DIAGNOSIS — L02211 Cutaneous abscess of abdominal wall: Secondary | ICD-10-CM | POA: Diagnosis not present

## 2017-12-20 DIAGNOSIS — K430 Incisional hernia with obstruction, without gangrene: Secondary | ICD-10-CM | POA: Diagnosis not present

## 2017-12-20 DIAGNOSIS — Z4801 Encounter for change or removal of surgical wound dressing: Secondary | ICD-10-CM | POA: Diagnosis not present

## 2017-12-20 DIAGNOSIS — I10 Essential (primary) hypertension: Secondary | ICD-10-CM | POA: Diagnosis not present

## 2017-12-20 DIAGNOSIS — R9431 Abnormal electrocardiogram [ECG] [EKG]: Secondary | ICD-10-CM | POA: Diagnosis not present

## 2017-12-20 DIAGNOSIS — R1031 Right lower quadrant pain: Secondary | ICD-10-CM | POA: Diagnosis not present

## 2017-12-20 DIAGNOSIS — E669 Obesity, unspecified: Secondary | ICD-10-CM | POA: Diagnosis not present

## 2017-12-20 DIAGNOSIS — B9620 Unspecified Escherichia coli [E. coli] as the cause of diseases classified elsewhere: Secondary | ICD-10-CM | POA: Diagnosis not present

## 2017-12-20 DIAGNOSIS — L02211 Cutaneous abscess of abdominal wall: Secondary | ICD-10-CM | POA: Diagnosis not present

## 2017-12-20 DIAGNOSIS — K632 Fistula of intestine: Secondary | ICD-10-CM | POA: Diagnosis not present

## 2017-12-20 DIAGNOSIS — T8183XA Persistent postprocedural fistula, initial encounter: Secondary | ICD-10-CM | POA: Diagnosis not present

## 2017-12-20 DIAGNOSIS — Z4889 Encounter for other specified surgical aftercare: Secondary | ICD-10-CM | POA: Diagnosis not present

## 2017-12-20 DIAGNOSIS — Z87891 Personal history of nicotine dependence: Secondary | ICD-10-CM | POA: Diagnosis not present

## 2017-12-20 DIAGNOSIS — T8143XA Infection following a procedure, organ and space surgical site, initial encounter: Secondary | ICD-10-CM | POA: Diagnosis not present

## 2017-12-20 DIAGNOSIS — B961 Klebsiella pneumoniae [K. pneumoniae] as the cause of diseases classified elsewhere: Secondary | ICD-10-CM | POA: Diagnosis not present

## 2017-12-20 DIAGNOSIS — T8149XA Infection following a procedure, other surgical site, initial encounter: Secondary | ICD-10-CM | POA: Diagnosis not present

## 2017-12-22 DIAGNOSIS — N2889 Other specified disorders of kidney and ureter: Secondary | ICD-10-CM | POA: Diagnosis not present

## 2017-12-27 DIAGNOSIS — K632 Fistula of intestine: Secondary | ICD-10-CM | POA: Diagnosis not present

## 2017-12-28 DIAGNOSIS — B961 Klebsiella pneumoniae [K. pneumoniae] as the cause of diseases classified elsewhere: Secondary | ICD-10-CM | POA: Diagnosis not present

## 2017-12-28 DIAGNOSIS — B9620 Unspecified Escherichia coli [E. coli] as the cause of diseases classified elsewhere: Secondary | ICD-10-CM | POA: Diagnosis not present

## 2017-12-28 DIAGNOSIS — T8183XA Persistent postprocedural fistula, initial encounter: Secondary | ICD-10-CM | POA: Diagnosis not present

## 2017-12-28 DIAGNOSIS — T8143XA Infection following a procedure, organ and space surgical site, initial encounter: Secondary | ICD-10-CM | POA: Diagnosis not present

## 2017-12-28 DIAGNOSIS — K430 Incisional hernia with obstruction, without gangrene: Secondary | ICD-10-CM | POA: Diagnosis not present

## 2017-12-28 DIAGNOSIS — L02211 Cutaneous abscess of abdominal wall: Secondary | ICD-10-CM | POA: Diagnosis not present

## 2017-12-28 DIAGNOSIS — K632 Fistula of intestine: Secondary | ICD-10-CM | POA: Diagnosis not present

## 2017-12-28 DIAGNOSIS — E669 Obesity, unspecified: Secondary | ICD-10-CM | POA: Diagnosis not present

## 2017-12-30 DIAGNOSIS — K760 Fatty (change of) liver, not elsewhere classified: Secondary | ICD-10-CM | POA: Diagnosis not present

## 2017-12-31 DIAGNOSIS — R188 Other ascites: Secondary | ICD-10-CM | POA: Diagnosis not present

## 2017-12-31 DIAGNOSIS — K632 Fistula of intestine: Secondary | ICD-10-CM | POA: Diagnosis not present

## 2018-01-01 DIAGNOSIS — K632 Fistula of intestine: Secondary | ICD-10-CM | POA: Diagnosis not present

## 2018-01-02 DIAGNOSIS — T8143XD Infection following a procedure, organ and space surgical site, subsequent encounter: Secondary | ICD-10-CM | POA: Diagnosis not present

## 2018-01-02 DIAGNOSIS — I10 Essential (primary) hypertension: Secondary | ICD-10-CM | POA: Diagnosis not present

## 2018-01-02 DIAGNOSIS — T8183XD Persistent postprocedural fistula, subsequent encounter: Secondary | ICD-10-CM | POA: Diagnosis not present

## 2018-01-02 DIAGNOSIS — K632 Fistula of intestine: Secondary | ICD-10-CM | POA: Diagnosis not present

## 2018-01-02 DIAGNOSIS — F329 Major depressive disorder, single episode, unspecified: Secondary | ICD-10-CM | POA: Diagnosis not present

## 2018-01-02 DIAGNOSIS — M109 Gout, unspecified: Secondary | ICD-10-CM | POA: Diagnosis not present

## 2018-01-03 DIAGNOSIS — T8183XD Persistent postprocedural fistula, subsequent encounter: Secondary | ICD-10-CM | POA: Diagnosis not present

## 2018-01-03 DIAGNOSIS — T8143XD Infection following a procedure, organ and space surgical site, subsequent encounter: Secondary | ICD-10-CM | POA: Diagnosis not present

## 2018-01-03 DIAGNOSIS — K430 Incisional hernia with obstruction, without gangrene: Secondary | ICD-10-CM | POA: Diagnosis not present

## 2018-01-03 DIAGNOSIS — E669 Obesity, unspecified: Secondary | ICD-10-CM | POA: Diagnosis not present

## 2018-01-03 DIAGNOSIS — K632 Fistula of intestine: Secondary | ICD-10-CM | POA: Diagnosis not present

## 2018-01-03 DIAGNOSIS — I10 Essential (primary) hypertension: Secondary | ICD-10-CM | POA: Diagnosis not present

## 2018-01-03 DIAGNOSIS — M109 Gout, unspecified: Secondary | ICD-10-CM | POA: Diagnosis not present

## 2018-01-03 DIAGNOSIS — F329 Major depressive disorder, single episode, unspecified: Secondary | ICD-10-CM | POA: Diagnosis not present

## 2018-01-06 DIAGNOSIS — Z8719 Personal history of other diseases of the digestive system: Secondary | ICD-10-CM | POA: Diagnosis not present

## 2018-01-11 DIAGNOSIS — K632 Fistula of intestine: Secondary | ICD-10-CM | POA: Diagnosis not present

## 2018-01-11 DIAGNOSIS — T8143XD Infection following a procedure, organ and space surgical site, subsequent encounter: Secondary | ICD-10-CM | POA: Diagnosis not present

## 2018-01-11 DIAGNOSIS — K430 Incisional hernia with obstruction, without gangrene: Secondary | ICD-10-CM | POA: Diagnosis not present

## 2018-01-11 DIAGNOSIS — F329 Major depressive disorder, single episode, unspecified: Secondary | ICD-10-CM | POA: Diagnosis not present

## 2018-01-11 DIAGNOSIS — I10 Essential (primary) hypertension: Secondary | ICD-10-CM | POA: Diagnosis not present

## 2018-01-11 DIAGNOSIS — T8183XD Persistent postprocedural fistula, subsequent encounter: Secondary | ICD-10-CM | POA: Diagnosis not present

## 2018-01-11 DIAGNOSIS — M109 Gout, unspecified: Secondary | ICD-10-CM | POA: Diagnosis not present

## 2018-01-11 DIAGNOSIS — E669 Obesity, unspecified: Secondary | ICD-10-CM | POA: Diagnosis not present

## 2018-01-17 DIAGNOSIS — K632 Fistula of intestine: Secondary | ICD-10-CM | POA: Diagnosis not present

## 2018-02-21 DIAGNOSIS — K660 Peritoneal adhesions (postprocedural) (postinfection): Secondary | ICD-10-CM | POA: Diagnosis not present

## 2018-02-21 DIAGNOSIS — N281 Cyst of kidney, acquired: Secondary | ICD-10-CM | POA: Diagnosis not present

## 2018-02-21 DIAGNOSIS — N2889 Other specified disorders of kidney and ureter: Secondary | ICD-10-CM | POA: Diagnosis not present

## 2018-02-21 DIAGNOSIS — K632 Fistula of intestine: Secondary | ICD-10-CM | POA: Diagnosis not present

## 2018-02-28 DIAGNOSIS — L02211 Cutaneous abscess of abdominal wall: Secondary | ICD-10-CM | POA: Diagnosis not present

## 2018-03-04 DIAGNOSIS — M109 Gout, unspecified: Secondary | ICD-10-CM | POA: Diagnosis not present

## 2018-03-04 DIAGNOSIS — F17210 Nicotine dependence, cigarettes, uncomplicated: Secondary | ICD-10-CM | POA: Diagnosis not present

## 2018-03-07 DIAGNOSIS — M109 Gout, unspecified: Secondary | ICD-10-CM | POA: Diagnosis not present

## 2018-03-14 DIAGNOSIS — Z79899 Other long term (current) drug therapy: Secondary | ICD-10-CM | POA: Diagnosis not present

## 2018-03-14 DIAGNOSIS — M109 Gout, unspecified: Secondary | ICD-10-CM | POA: Diagnosis not present

## 2018-03-14 DIAGNOSIS — R109 Unspecified abdominal pain: Secondary | ICD-10-CM | POA: Diagnosis not present

## 2018-03-14 DIAGNOSIS — N2889 Other specified disorders of kidney and ureter: Secondary | ICD-10-CM | POA: Diagnosis not present

## 2018-03-20 DIAGNOSIS — G8929 Other chronic pain: Secondary | ICD-10-CM | POA: Diagnosis not present

## 2018-03-20 DIAGNOSIS — Z79899 Other long term (current) drug therapy: Secondary | ICD-10-CM | POA: Diagnosis not present

## 2018-03-20 DIAGNOSIS — I10 Essential (primary) hypertension: Secondary | ICD-10-CM | POA: Diagnosis not present

## 2018-03-20 DIAGNOSIS — R109 Unspecified abdominal pain: Secondary | ICD-10-CM | POA: Diagnosis not present

## 2018-03-20 DIAGNOSIS — J019 Acute sinusitis, unspecified: Secondary | ICD-10-CM | POA: Diagnosis not present

## 2018-03-20 DIAGNOSIS — F17210 Nicotine dependence, cigarettes, uncomplicated: Secondary | ICD-10-CM | POA: Diagnosis not present

## 2018-03-26 DIAGNOSIS — N289 Disorder of kidney and ureter, unspecified: Secondary | ICD-10-CM | POA: Diagnosis not present

## 2018-03-26 DIAGNOSIS — N2889 Other specified disorders of kidney and ureter: Secondary | ICD-10-CM | POA: Diagnosis not present

## 2018-03-27 DIAGNOSIS — J019 Acute sinusitis, unspecified: Secondary | ICD-10-CM | POA: Diagnosis not present

## 2018-03-27 DIAGNOSIS — L02211 Cutaneous abscess of abdominal wall: Secondary | ICD-10-CM | POA: Diagnosis not present

## 2018-03-27 DIAGNOSIS — F17210 Nicotine dependence, cigarettes, uncomplicated: Secondary | ICD-10-CM | POA: Diagnosis not present

## 2018-03-27 DIAGNOSIS — R7302 Impaired glucose tolerance (oral): Secondary | ICD-10-CM | POA: Diagnosis not present

## 2018-03-27 DIAGNOSIS — R509 Fever, unspecified: Secondary | ICD-10-CM | POA: Diagnosis not present

## 2018-03-27 DIAGNOSIS — R5081 Fever presenting with conditions classified elsewhere: Secondary | ICD-10-CM | POA: Diagnosis not present

## 2018-03-27 DIAGNOSIS — R1031 Right lower quadrant pain: Secondary | ICD-10-CM | POA: Diagnosis not present

## 2018-03-27 DIAGNOSIS — R188 Other ascites: Secondary | ICD-10-CM | POA: Diagnosis not present

## 2018-03-27 DIAGNOSIS — Z8719 Personal history of other diseases of the digestive system: Secondary | ICD-10-CM | POA: Diagnosis not present

## 2018-03-27 DIAGNOSIS — E7439 Other disorders of intestinal carbohydrate absorption: Secondary | ICD-10-CM | POA: Diagnosis not present

## 2018-03-27 DIAGNOSIS — I10 Essential (primary) hypertension: Secondary | ICD-10-CM | POA: Diagnosis not present

## 2018-03-27 DIAGNOSIS — K430 Incisional hernia with obstruction, without gangrene: Secondary | ICD-10-CM | POA: Diagnosis present

## 2018-03-27 DIAGNOSIS — T8141XA Infection following a procedure, superficial incisional surgical site, initial encounter: Secondary | ICD-10-CM | POA: Diagnosis present

## 2018-04-03 DIAGNOSIS — Z452 Encounter for adjustment and management of vascular access device: Secondary | ICD-10-CM | POA: Diagnosis not present

## 2018-04-03 DIAGNOSIS — E669 Obesity, unspecified: Secondary | ICD-10-CM | POA: Diagnosis not present

## 2018-04-03 DIAGNOSIS — M1990 Unspecified osteoarthritis, unspecified site: Secondary | ICD-10-CM | POA: Diagnosis not present

## 2018-04-03 DIAGNOSIS — K632 Fistula of intestine: Secondary | ICD-10-CM | POA: Diagnosis not present

## 2018-04-03 DIAGNOSIS — T8143XD Infection following a procedure, organ and space surgical site, subsequent encounter: Secondary | ICD-10-CM | POA: Diagnosis not present

## 2018-04-03 DIAGNOSIS — F329 Major depressive disorder, single episode, unspecified: Secondary | ICD-10-CM | POA: Diagnosis not present

## 2018-04-03 DIAGNOSIS — Z87891 Personal history of nicotine dependence: Secondary | ICD-10-CM | POA: Diagnosis not present

## 2018-04-03 DIAGNOSIS — I10 Essential (primary) hypertension: Secondary | ICD-10-CM | POA: Diagnosis not present

## 2018-04-03 DIAGNOSIS — T8183XD Persistent postprocedural fistula, subsequent encounter: Secondary | ICD-10-CM | POA: Diagnosis not present

## 2018-04-03 DIAGNOSIS — M109 Gout, unspecified: Secondary | ICD-10-CM | POA: Diagnosis not present

## 2018-04-03 DIAGNOSIS — T8140XD Infection following a procedure, unspecified, subsequent encounter: Secondary | ICD-10-CM | POA: Diagnosis not present

## 2018-04-03 DIAGNOSIS — Z9181 History of falling: Secondary | ICD-10-CM | POA: Diagnosis not present

## 2018-04-03 DIAGNOSIS — L02211 Cutaneous abscess of abdominal wall: Secondary | ICD-10-CM | POA: Diagnosis not present

## 2018-04-04 DIAGNOSIS — T8183XD Persistent postprocedural fistula, subsequent encounter: Secondary | ICD-10-CM | POA: Diagnosis not present

## 2018-04-04 DIAGNOSIS — L02211 Cutaneous abscess of abdominal wall: Secondary | ICD-10-CM | POA: Diagnosis not present

## 2018-04-04 DIAGNOSIS — K632 Fistula of intestine: Secondary | ICD-10-CM | POA: Diagnosis not present

## 2018-04-04 DIAGNOSIS — I10 Essential (primary) hypertension: Secondary | ICD-10-CM | POA: Diagnosis not present

## 2018-04-04 DIAGNOSIS — T8143XD Infection following a procedure, organ and space surgical site, subsequent encounter: Secondary | ICD-10-CM | POA: Diagnosis not present

## 2018-04-04 DIAGNOSIS — T8140XD Infection following a procedure, unspecified, subsequent encounter: Secondary | ICD-10-CM | POA: Diagnosis not present

## 2018-04-05 DIAGNOSIS — K632 Fistula of intestine: Secondary | ICD-10-CM | POA: Diagnosis not present

## 2018-04-05 DIAGNOSIS — T8183XD Persistent postprocedural fistula, subsequent encounter: Secondary | ICD-10-CM | POA: Diagnosis not present

## 2018-04-05 DIAGNOSIS — K430 Incisional hernia with obstruction, without gangrene: Secondary | ICD-10-CM | POA: Diagnosis not present

## 2018-04-05 DIAGNOSIS — T8143XD Infection following a procedure, organ and space surgical site, subsequent encounter: Secondary | ICD-10-CM | POA: Diagnosis not present

## 2018-04-05 DIAGNOSIS — L02211 Cutaneous abscess of abdominal wall: Secondary | ICD-10-CM | POA: Diagnosis not present

## 2018-04-05 DIAGNOSIS — E669 Obesity, unspecified: Secondary | ICD-10-CM | POA: Diagnosis not present

## 2018-04-05 DIAGNOSIS — I10 Essential (primary) hypertension: Secondary | ICD-10-CM | POA: Diagnosis not present

## 2018-04-05 DIAGNOSIS — T8140XD Infection following a procedure, unspecified, subsequent encounter: Secondary | ICD-10-CM | POA: Diagnosis not present

## 2018-04-11 DIAGNOSIS — R188 Other ascites: Secondary | ICD-10-CM | POA: Diagnosis not present

## 2018-04-11 DIAGNOSIS — K632 Fistula of intestine: Secondary | ICD-10-CM | POA: Diagnosis not present

## 2018-04-11 DIAGNOSIS — K432 Incisional hernia without obstruction or gangrene: Secondary | ICD-10-CM | POA: Diagnosis not present

## 2018-04-12 DIAGNOSIS — T8143XD Infection following a procedure, organ and space surgical site, subsequent encounter: Secondary | ICD-10-CM | POA: Diagnosis not present

## 2018-04-12 DIAGNOSIS — L02211 Cutaneous abscess of abdominal wall: Secondary | ICD-10-CM | POA: Diagnosis not present

## 2018-04-12 DIAGNOSIS — K430 Incisional hernia with obstruction, without gangrene: Secondary | ICD-10-CM | POA: Diagnosis not present

## 2018-04-12 DIAGNOSIS — E669 Obesity, unspecified: Secondary | ICD-10-CM | POA: Diagnosis not present

## 2018-04-12 DIAGNOSIS — T8140XD Infection following a procedure, unspecified, subsequent encounter: Secondary | ICD-10-CM | POA: Diagnosis not present

## 2018-04-12 DIAGNOSIS — I10 Essential (primary) hypertension: Secondary | ICD-10-CM | POA: Diagnosis not present

## 2018-04-12 DIAGNOSIS — K632 Fistula of intestine: Secondary | ICD-10-CM | POA: Diagnosis not present

## 2018-04-12 DIAGNOSIS — T8183XD Persistent postprocedural fistula, subsequent encounter: Secondary | ICD-10-CM | POA: Diagnosis not present

## 2018-04-15 DIAGNOSIS — R109 Unspecified abdominal pain: Secondary | ICD-10-CM | POA: Diagnosis not present

## 2018-04-15 DIAGNOSIS — I10 Essential (primary) hypertension: Secondary | ICD-10-CM | POA: Diagnosis not present

## 2018-04-15 DIAGNOSIS — R42 Dizziness and giddiness: Secondary | ICD-10-CM | POA: Diagnosis not present

## 2018-04-15 DIAGNOSIS — L02211 Cutaneous abscess of abdominal wall: Secondary | ICD-10-CM | POA: Diagnosis not present

## 2018-04-18 DIAGNOSIS — L02211 Cutaneous abscess of abdominal wall: Secondary | ICD-10-CM | POA: Diagnosis not present

## 2018-04-19 DIAGNOSIS — L02211 Cutaneous abscess of abdominal wall: Secondary | ICD-10-CM | POA: Diagnosis not present

## 2018-04-19 DIAGNOSIS — T8140XD Infection following a procedure, unspecified, subsequent encounter: Secondary | ICD-10-CM | POA: Diagnosis not present

## 2018-04-19 DIAGNOSIS — I10 Essential (primary) hypertension: Secondary | ICD-10-CM | POA: Diagnosis not present

## 2018-04-19 DIAGNOSIS — K632 Fistula of intestine: Secondary | ICD-10-CM | POA: Diagnosis not present

## 2018-04-19 DIAGNOSIS — E669 Obesity, unspecified: Secondary | ICD-10-CM | POA: Diagnosis not present

## 2018-04-19 DIAGNOSIS — T8143XD Infection following a procedure, organ and space surgical site, subsequent encounter: Secondary | ICD-10-CM | POA: Diagnosis not present

## 2018-04-19 DIAGNOSIS — K430 Incisional hernia with obstruction, without gangrene: Secondary | ICD-10-CM | POA: Diagnosis not present

## 2018-04-19 DIAGNOSIS — T8183XD Persistent postprocedural fistula, subsequent encounter: Secondary | ICD-10-CM | POA: Diagnosis not present

## 2018-04-26 DIAGNOSIS — I10 Essential (primary) hypertension: Secondary | ICD-10-CM | POA: Diagnosis not present

## 2018-04-26 DIAGNOSIS — K632 Fistula of intestine: Secondary | ICD-10-CM | POA: Diagnosis not present

## 2018-04-26 DIAGNOSIS — K430 Incisional hernia with obstruction, without gangrene: Secondary | ICD-10-CM | POA: Diagnosis not present

## 2018-04-26 DIAGNOSIS — T8183XD Persistent postprocedural fistula, subsequent encounter: Secondary | ICD-10-CM | POA: Diagnosis not present

## 2018-04-26 DIAGNOSIS — T8140XD Infection following a procedure, unspecified, subsequent encounter: Secondary | ICD-10-CM | POA: Diagnosis not present

## 2018-04-26 DIAGNOSIS — E669 Obesity, unspecified: Secondary | ICD-10-CM | POA: Diagnosis not present

## 2018-04-26 DIAGNOSIS — T8143XD Infection following a procedure, organ and space surgical site, subsequent encounter: Secondary | ICD-10-CM | POA: Diagnosis not present

## 2018-04-26 DIAGNOSIS — L02211 Cutaneous abscess of abdominal wall: Secondary | ICD-10-CM | POA: Diagnosis not present

## 2018-05-03 DIAGNOSIS — T8183XD Persistent postprocedural fistula, subsequent encounter: Secondary | ICD-10-CM | POA: Diagnosis not present

## 2018-05-03 DIAGNOSIS — K632 Fistula of intestine: Secondary | ICD-10-CM | POA: Diagnosis not present

## 2018-05-03 DIAGNOSIS — M1990 Unspecified osteoarthritis, unspecified site: Secondary | ICD-10-CM | POA: Diagnosis not present

## 2018-05-03 DIAGNOSIS — L02211 Cutaneous abscess of abdominal wall: Secondary | ICD-10-CM | POA: Diagnosis not present

## 2018-05-03 DIAGNOSIS — Z9181 History of falling: Secondary | ICD-10-CM | POA: Diagnosis not present

## 2018-05-03 DIAGNOSIS — E669 Obesity, unspecified: Secondary | ICD-10-CM | POA: Diagnosis not present

## 2018-05-03 DIAGNOSIS — K430 Incisional hernia with obstruction, without gangrene: Secondary | ICD-10-CM | POA: Diagnosis not present

## 2018-05-03 DIAGNOSIS — Z452 Encounter for adjustment and management of vascular access device: Secondary | ICD-10-CM | POA: Diagnosis not present

## 2018-05-03 DIAGNOSIS — M109 Gout, unspecified: Secondary | ICD-10-CM | POA: Diagnosis not present

## 2018-05-03 DIAGNOSIS — Z87891 Personal history of nicotine dependence: Secondary | ICD-10-CM | POA: Diagnosis not present

## 2018-05-03 DIAGNOSIS — T8140XD Infection following a procedure, unspecified, subsequent encounter: Secondary | ICD-10-CM | POA: Diagnosis not present

## 2018-05-03 DIAGNOSIS — T8143XD Infection following a procedure, organ and space surgical site, subsequent encounter: Secondary | ICD-10-CM | POA: Diagnosis not present

## 2018-05-03 DIAGNOSIS — I10 Essential (primary) hypertension: Secondary | ICD-10-CM | POA: Diagnosis not present

## 2018-05-03 DIAGNOSIS — F329 Major depressive disorder, single episode, unspecified: Secondary | ICD-10-CM | POA: Diagnosis not present

## 2018-05-04 DIAGNOSIS — K632 Fistula of intestine: Secondary | ICD-10-CM | POA: Diagnosis not present

## 2018-05-04 DIAGNOSIS — I10 Essential (primary) hypertension: Secondary | ICD-10-CM | POA: Diagnosis not present

## 2018-05-06 DIAGNOSIS — K651 Peritoneal abscess: Secondary | ICD-10-CM | POA: Diagnosis not present

## 2018-05-06 DIAGNOSIS — K439 Ventral hernia without obstruction or gangrene: Secondary | ICD-10-CM | POA: Diagnosis not present

## 2018-05-10 DIAGNOSIS — L02211 Cutaneous abscess of abdominal wall: Secondary | ICD-10-CM | POA: Diagnosis not present

## 2018-05-10 DIAGNOSIS — K632 Fistula of intestine: Secondary | ICD-10-CM | POA: Diagnosis not present

## 2018-05-10 DIAGNOSIS — E669 Obesity, unspecified: Secondary | ICD-10-CM | POA: Diagnosis not present

## 2018-05-10 DIAGNOSIS — T8183XD Persistent postprocedural fistula, subsequent encounter: Secondary | ICD-10-CM | POA: Diagnosis not present

## 2018-05-10 DIAGNOSIS — I10 Essential (primary) hypertension: Secondary | ICD-10-CM | POA: Diagnosis not present

## 2018-05-10 DIAGNOSIS — K430 Incisional hernia with obstruction, without gangrene: Secondary | ICD-10-CM | POA: Diagnosis not present

## 2018-05-10 DIAGNOSIS — T8143XD Infection following a procedure, organ and space surgical site, subsequent encounter: Secondary | ICD-10-CM | POA: Diagnosis not present

## 2018-05-10 DIAGNOSIS — T8140XD Infection following a procedure, unspecified, subsequent encounter: Secondary | ICD-10-CM | POA: Diagnosis not present

## 2018-05-13 DIAGNOSIS — R110 Nausea: Secondary | ICD-10-CM | POA: Diagnosis not present

## 2018-05-13 DIAGNOSIS — F329 Major depressive disorder, single episode, unspecified: Secondary | ICD-10-CM | POA: Diagnosis not present

## 2018-05-13 DIAGNOSIS — I10 Essential (primary) hypertension: Secondary | ICD-10-CM | POA: Diagnosis not present

## 2018-05-13 DIAGNOSIS — Z79899 Other long term (current) drug therapy: Secondary | ICD-10-CM | POA: Diagnosis not present

## 2018-05-13 DIAGNOSIS — R109 Unspecified abdominal pain: Secondary | ICD-10-CM | POA: Diagnosis not present

## 2018-05-17 DIAGNOSIS — T8140XD Infection following a procedure, unspecified, subsequent encounter: Secondary | ICD-10-CM | POA: Diagnosis not present

## 2018-05-17 DIAGNOSIS — L02211 Cutaneous abscess of abdominal wall: Secondary | ICD-10-CM | POA: Diagnosis not present

## 2018-05-17 DIAGNOSIS — I10 Essential (primary) hypertension: Secondary | ICD-10-CM | POA: Diagnosis not present

## 2018-05-17 DIAGNOSIS — T8183XD Persistent postprocedural fistula, subsequent encounter: Secondary | ICD-10-CM | POA: Diagnosis not present

## 2018-05-17 DIAGNOSIS — E669 Obesity, unspecified: Secondary | ICD-10-CM | POA: Diagnosis not present

## 2018-05-17 DIAGNOSIS — K632 Fistula of intestine: Secondary | ICD-10-CM | POA: Diagnosis not present

## 2018-05-17 DIAGNOSIS — K430 Incisional hernia with obstruction, without gangrene: Secondary | ICD-10-CM | POA: Diagnosis not present

## 2018-05-17 DIAGNOSIS — T8143XD Infection following a procedure, organ and space surgical site, subsequent encounter: Secondary | ICD-10-CM | POA: Diagnosis not present

## 2018-05-24 DIAGNOSIS — T8140XD Infection following a procedure, unspecified, subsequent encounter: Secondary | ICD-10-CM | POA: Diagnosis not present

## 2018-05-24 DIAGNOSIS — I10 Essential (primary) hypertension: Secondary | ICD-10-CM | POA: Diagnosis not present

## 2018-05-24 DIAGNOSIS — T8183XD Persistent postprocedural fistula, subsequent encounter: Secondary | ICD-10-CM | POA: Diagnosis not present

## 2018-05-24 DIAGNOSIS — K632 Fistula of intestine: Secondary | ICD-10-CM | POA: Diagnosis not present

## 2018-05-24 DIAGNOSIS — L02211 Cutaneous abscess of abdominal wall: Secondary | ICD-10-CM | POA: Diagnosis not present

## 2018-05-24 DIAGNOSIS — T8143XD Infection following a procedure, organ and space surgical site, subsequent encounter: Secondary | ICD-10-CM | POA: Diagnosis not present

## 2018-07-04 DIAGNOSIS — L02211 Cutaneous abscess of abdominal wall: Secondary | ICD-10-CM | POA: Diagnosis not present

## 2018-07-15 DIAGNOSIS — F329 Major depressive disorder, single episode, unspecified: Secondary | ICD-10-CM | POA: Diagnosis not present

## 2018-07-15 DIAGNOSIS — I10 Essential (primary) hypertension: Secondary | ICD-10-CM | POA: Diagnosis not present

## 2018-07-15 DIAGNOSIS — T402X5A Adverse effect of other opioids, initial encounter: Secondary | ICD-10-CM | POA: Diagnosis not present

## 2018-07-15 DIAGNOSIS — R109 Unspecified abdominal pain: Secondary | ICD-10-CM | POA: Diagnosis not present

## 2018-08-04 DIAGNOSIS — K469 Unspecified abdominal hernia without obstruction or gangrene: Secondary | ICD-10-CM | POA: Diagnosis not present

## 2018-09-23 DIAGNOSIS — K5903 Drug induced constipation: Secondary | ICD-10-CM | POA: Diagnosis not present

## 2018-09-23 DIAGNOSIS — I10 Essential (primary) hypertension: Secondary | ICD-10-CM | POA: Diagnosis not present

## 2018-09-23 DIAGNOSIS — T402X5A Adverse effect of other opioids, initial encounter: Secondary | ICD-10-CM | POA: Diagnosis not present

## 2018-09-23 DIAGNOSIS — Z139 Encounter for screening, unspecified: Secondary | ICD-10-CM | POA: Diagnosis not present

## 2018-09-23 DIAGNOSIS — R109 Unspecified abdominal pain: Secondary | ICD-10-CM | POA: Diagnosis not present

## 2018-09-23 DIAGNOSIS — Z125 Encounter for screening for malignant neoplasm of prostate: Secondary | ICD-10-CM | POA: Diagnosis not present

## 2018-10-13 DIAGNOSIS — K432 Incisional hernia without obstruction or gangrene: Secondary | ICD-10-CM | POA: Diagnosis not present

## 2018-10-13 DIAGNOSIS — S31109D Unspecified open wound of abdominal wall, unspecified quadrant without penetration into peritoneal cavity, subsequent encounter: Secondary | ICD-10-CM | POA: Diagnosis not present

## 2018-10-17 DIAGNOSIS — Z6834 Body mass index (BMI) 34.0-34.9, adult: Secondary | ICD-10-CM | POA: Diagnosis not present

## 2018-10-17 DIAGNOSIS — L03312 Cellulitis of back [any part except buttock]: Secondary | ICD-10-CM | POA: Diagnosis not present

## 2018-10-17 DIAGNOSIS — L723 Sebaceous cyst: Secondary | ICD-10-CM | POA: Diagnosis not present

## 2019-01-23 DIAGNOSIS — Z9181 History of falling: Secondary | ICD-10-CM | POA: Diagnosis not present

## 2019-01-23 DIAGNOSIS — Z23 Encounter for immunization: Secondary | ICD-10-CM | POA: Diagnosis not present

## 2019-01-23 DIAGNOSIS — K439 Ventral hernia without obstruction or gangrene: Secondary | ICD-10-CM | POA: Diagnosis not present

## 2019-01-23 DIAGNOSIS — F329 Major depressive disorder, single episode, unspecified: Secondary | ICD-10-CM | POA: Diagnosis not present

## 2019-01-23 DIAGNOSIS — Z79899 Other long term (current) drug therapy: Secondary | ICD-10-CM | POA: Diagnosis not present

## 2019-01-23 DIAGNOSIS — I10 Essential (primary) hypertension: Secondary | ICD-10-CM | POA: Diagnosis not present

## 2019-01-23 DIAGNOSIS — R109 Unspecified abdominal pain: Secondary | ICD-10-CM | POA: Diagnosis not present

## 2019-03-31 DIAGNOSIS — F329 Major depressive disorder, single episode, unspecified: Secondary | ICD-10-CM | POA: Diagnosis not present

## 2019-03-31 DIAGNOSIS — K439 Ventral hernia without obstruction or gangrene: Secondary | ICD-10-CM | POA: Diagnosis not present

## 2019-03-31 DIAGNOSIS — R109 Unspecified abdominal pain: Secondary | ICD-10-CM | POA: Diagnosis not present

## 2019-03-31 DIAGNOSIS — I10 Essential (primary) hypertension: Secondary | ICD-10-CM | POA: Diagnosis not present

## 2019-04-06 DIAGNOSIS — I10 Essential (primary) hypertension: Secondary | ICD-10-CM | POA: Diagnosis not present

## 2019-04-06 DIAGNOSIS — R1030 Lower abdominal pain, unspecified: Secondary | ICD-10-CM | POA: Diagnosis not present

## 2019-04-06 DIAGNOSIS — R0602 Shortness of breath: Secondary | ICD-10-CM | POA: Diagnosis not present

## 2019-04-06 DIAGNOSIS — K5900 Constipation, unspecified: Secondary | ICD-10-CM | POA: Diagnosis not present

## 2019-04-06 DIAGNOSIS — Z87891 Personal history of nicotine dependence: Secondary | ICD-10-CM | POA: Diagnosis not present

## 2019-04-06 DIAGNOSIS — Z20822 Contact with and (suspected) exposure to covid-19: Secondary | ICD-10-CM | POA: Diagnosis not present

## 2019-04-14 DIAGNOSIS — M1710 Unilateral primary osteoarthritis, unspecified knee: Secondary | ICD-10-CM | POA: Diagnosis not present

## 2019-04-14 DIAGNOSIS — Z6833 Body mass index (BMI) 33.0-33.9, adult: Secondary | ICD-10-CM | POA: Diagnosis not present

## 2019-04-14 DIAGNOSIS — M1712 Unilateral primary osteoarthritis, left knee: Secondary | ICD-10-CM | POA: Diagnosis not present

## 2019-04-27 DIAGNOSIS — H04123 Dry eye syndrome of bilateral lacrimal glands: Secondary | ICD-10-CM | POA: Diagnosis not present

## 2019-04-27 DIAGNOSIS — H26493 Other secondary cataract, bilateral: Secondary | ICD-10-CM | POA: Diagnosis not present

## 2019-04-27 DIAGNOSIS — R7303 Prediabetes: Secondary | ICD-10-CM | POA: Diagnosis not present

## 2019-05-15 DIAGNOSIS — K439 Ventral hernia without obstruction or gangrene: Secondary | ICD-10-CM | POA: Diagnosis not present

## 2019-05-15 DIAGNOSIS — R109 Unspecified abdominal pain: Secondary | ICD-10-CM | POA: Diagnosis not present

## 2019-05-15 DIAGNOSIS — M109 Gout, unspecified: Secondary | ICD-10-CM | POA: Diagnosis not present

## 2019-05-15 DIAGNOSIS — I10 Essential (primary) hypertension: Secondary | ICD-10-CM | POA: Diagnosis not present

## 2019-05-15 DIAGNOSIS — F319 Bipolar disorder, unspecified: Secondary | ICD-10-CM | POA: Diagnosis not present

## 2020-01-16 DIAGNOSIS — Z6832 Body mass index (BMI) 32.0-32.9, adult: Secondary | ICD-10-CM | POA: Diagnosis not present

## 2020-01-16 DIAGNOSIS — F32A Depression, unspecified: Secondary | ICD-10-CM | POA: Diagnosis not present

## 2020-01-16 DIAGNOSIS — K439 Ventral hernia without obstruction or gangrene: Secondary | ICD-10-CM | POA: Diagnosis not present

## 2020-01-16 DIAGNOSIS — M109 Gout, unspecified: Secondary | ICD-10-CM | POA: Diagnosis not present

## 2020-01-16 DIAGNOSIS — I10 Essential (primary) hypertension: Secondary | ICD-10-CM | POA: Diagnosis not present

## 2020-01-16 DIAGNOSIS — G4700 Insomnia, unspecified: Secondary | ICD-10-CM | POA: Diagnosis not present

## 2020-01-16 DIAGNOSIS — Z79899 Other long term (current) drug therapy: Secondary | ICD-10-CM | POA: Diagnosis not present

## 2020-01-16 DIAGNOSIS — Z23 Encounter for immunization: Secondary | ICD-10-CM | POA: Diagnosis not present

## 2020-01-16 DIAGNOSIS — M1710 Unilateral primary osteoarthritis, unspecified knee: Secondary | ICD-10-CM | POA: Diagnosis not present

## 2020-01-16 DIAGNOSIS — R109 Unspecified abdominal pain: Secondary | ICD-10-CM | POA: Diagnosis not present

## 2020-01-16 DIAGNOSIS — Z139 Encounter for screening, unspecified: Secondary | ICD-10-CM | POA: Diagnosis not present

## 2020-01-16 DIAGNOSIS — K5909 Other constipation: Secondary | ICD-10-CM | POA: Diagnosis not present

## 2020-02-21 DIAGNOSIS — Z6832 Body mass index (BMI) 32.0-32.9, adult: Secondary | ICD-10-CM | POA: Diagnosis not present

## 2020-02-21 DIAGNOSIS — I10 Essential (primary) hypertension: Secondary | ICD-10-CM | POA: Diagnosis not present

## 2020-02-21 DIAGNOSIS — Z9181 History of falling: Secondary | ICD-10-CM | POA: Diagnosis not present

## 2020-02-21 DIAGNOSIS — R109 Unspecified abdominal pain: Secondary | ICD-10-CM | POA: Diagnosis not present

## 2020-02-21 DIAGNOSIS — K439 Ventral hernia without obstruction or gangrene: Secondary | ICD-10-CM | POA: Diagnosis not present

## 2020-02-21 DIAGNOSIS — F32A Depression, unspecified: Secondary | ICD-10-CM | POA: Diagnosis not present

## 2020-03-22 DIAGNOSIS — K439 Ventral hernia without obstruction or gangrene: Secondary | ICD-10-CM | POA: Diagnosis not present

## 2020-03-22 DIAGNOSIS — F32A Depression, unspecified: Secondary | ICD-10-CM | POA: Diagnosis not present

## 2020-03-22 DIAGNOSIS — I10 Essential (primary) hypertension: Secondary | ICD-10-CM | POA: Diagnosis not present

## 2020-03-22 DIAGNOSIS — R109 Unspecified abdominal pain: Secondary | ICD-10-CM | POA: Diagnosis not present

## 2020-03-22 DIAGNOSIS — Z6832 Body mass index (BMI) 32.0-32.9, adult: Secondary | ICD-10-CM | POA: Diagnosis not present

## 2020-05-22 DIAGNOSIS — K439 Ventral hernia without obstruction or gangrene: Secondary | ICD-10-CM | POA: Diagnosis not present

## 2020-05-22 DIAGNOSIS — G4700 Insomnia, unspecified: Secondary | ICD-10-CM | POA: Diagnosis not present

## 2020-05-22 DIAGNOSIS — Z79899 Other long term (current) drug therapy: Secondary | ICD-10-CM | POA: Diagnosis not present

## 2020-05-22 DIAGNOSIS — R109 Unspecified abdominal pain: Secondary | ICD-10-CM | POA: Diagnosis not present

## 2020-05-22 DIAGNOSIS — R5383 Other fatigue: Secondary | ICD-10-CM | POA: Diagnosis not present

## 2020-05-22 DIAGNOSIS — Z6832 Body mass index (BMI) 32.0-32.9, adult: Secondary | ICD-10-CM | POA: Diagnosis not present

## 2020-05-22 DIAGNOSIS — F32A Depression, unspecified: Secondary | ICD-10-CM | POA: Diagnosis not present

## 2020-05-22 DIAGNOSIS — I10 Essential (primary) hypertension: Secondary | ICD-10-CM | POA: Diagnosis not present

## 2020-05-22 DIAGNOSIS — K5903 Drug induced constipation: Secondary | ICD-10-CM | POA: Diagnosis not present

## 2020-05-22 DIAGNOSIS — T402X5A Adverse effect of other opioids, initial encounter: Secondary | ICD-10-CM | POA: Diagnosis not present

## 2020-07-26 DIAGNOSIS — I10 Essential (primary) hypertension: Secondary | ICD-10-CM | POA: Diagnosis not present

## 2020-07-26 DIAGNOSIS — T402X5A Adverse effect of other opioids, initial encounter: Secondary | ICD-10-CM | POA: Diagnosis not present

## 2020-07-26 DIAGNOSIS — F32A Depression, unspecified: Secondary | ICD-10-CM | POA: Diagnosis not present

## 2020-07-26 DIAGNOSIS — K5903 Drug induced constipation: Secondary | ICD-10-CM | POA: Diagnosis not present

## 2020-07-26 DIAGNOSIS — G4700 Insomnia, unspecified: Secondary | ICD-10-CM | POA: Diagnosis not present

## 2020-07-26 DIAGNOSIS — R109 Unspecified abdominal pain: Secondary | ICD-10-CM | POA: Diagnosis not present

## 2020-07-26 DIAGNOSIS — G4733 Obstructive sleep apnea (adult) (pediatric): Secondary | ICD-10-CM | POA: Diagnosis not present

## 2020-07-26 DIAGNOSIS — K439 Ventral hernia without obstruction or gangrene: Secondary | ICD-10-CM | POA: Diagnosis not present

## 2020-07-26 DIAGNOSIS — Z6833 Body mass index (BMI) 33.0-33.9, adult: Secondary | ICD-10-CM | POA: Diagnosis not present

## 2020-07-30 DIAGNOSIS — G4733 Obstructive sleep apnea (adult) (pediatric): Secondary | ICD-10-CM | POA: Diagnosis not present

## 2020-07-30 DIAGNOSIS — R0602 Shortness of breath: Secondary | ICD-10-CM | POA: Diagnosis not present

## 2020-07-31 DIAGNOSIS — G4733 Obstructive sleep apnea (adult) (pediatric): Secondary | ICD-10-CM | POA: Diagnosis not present

## 2020-07-31 DIAGNOSIS — Z1389 Encounter for screening for other disorder: Secondary | ICD-10-CM | POA: Diagnosis not present

## 2020-07-31 DIAGNOSIS — Z79899 Other long term (current) drug therapy: Secondary | ICD-10-CM | POA: Diagnosis not present

## 2020-07-31 DIAGNOSIS — R0602 Shortness of breath: Secondary | ICD-10-CM | POA: Diagnosis not present

## 2020-07-31 DIAGNOSIS — M170 Bilateral primary osteoarthritis of knee: Secondary | ICD-10-CM | POA: Diagnosis not present

## 2020-07-31 DIAGNOSIS — R109 Unspecified abdominal pain: Secondary | ICD-10-CM | POA: Diagnosis not present

## 2020-07-31 DIAGNOSIS — G894 Chronic pain syndrome: Secondary | ICD-10-CM | POA: Diagnosis not present

## 2020-08-04 DIAGNOSIS — M19072 Primary osteoarthritis, left ankle and foot: Secondary | ICD-10-CM | POA: Diagnosis not present

## 2020-08-04 DIAGNOSIS — M2012 Hallux valgus (acquired), left foot: Secondary | ICD-10-CM | POA: Diagnosis not present

## 2020-08-04 DIAGNOSIS — Z87891 Personal history of nicotine dependence: Secondary | ICD-10-CM | POA: Diagnosis not present

## 2020-08-04 DIAGNOSIS — S90122A Contusion of left lesser toe(s) without damage to nail, initial encounter: Secondary | ICD-10-CM | POA: Diagnosis not present

## 2020-08-04 DIAGNOSIS — S93102A Unspecified subluxation of left toe(s), initial encounter: Secondary | ICD-10-CM | POA: Diagnosis not present

## 2020-08-04 DIAGNOSIS — M109 Gout, unspecified: Secondary | ICD-10-CM | POA: Diagnosis not present

## 2020-08-04 DIAGNOSIS — S99922A Unspecified injury of left foot, initial encounter: Secondary | ICD-10-CM | POA: Diagnosis not present

## 2020-08-04 DIAGNOSIS — I10 Essential (primary) hypertension: Secondary | ICD-10-CM | POA: Diagnosis not present

## 2020-08-04 DIAGNOSIS — Z79899 Other long term (current) drug therapy: Secondary | ICD-10-CM | POA: Diagnosis not present

## 2020-08-16 DIAGNOSIS — G894 Chronic pain syndrome: Secondary | ICD-10-CM | POA: Diagnosis not present

## 2020-08-16 DIAGNOSIS — R109 Unspecified abdominal pain: Secondary | ICD-10-CM | POA: Diagnosis not present

## 2020-08-16 DIAGNOSIS — Z1389 Encounter for screening for other disorder: Secondary | ICD-10-CM | POA: Diagnosis not present

## 2020-08-16 DIAGNOSIS — Z79899 Other long term (current) drug therapy: Secondary | ICD-10-CM | POA: Diagnosis not present

## 2020-08-16 DIAGNOSIS — M170 Bilateral primary osteoarthritis of knee: Secondary | ICD-10-CM | POA: Diagnosis not present

## 2020-09-17 DIAGNOSIS — R109 Unspecified abdominal pain: Secondary | ICD-10-CM | POA: Diagnosis not present

## 2020-09-17 DIAGNOSIS — G894 Chronic pain syndrome: Secondary | ICD-10-CM | POA: Diagnosis not present

## 2020-09-17 DIAGNOSIS — Z1389 Encounter for screening for other disorder: Secondary | ICD-10-CM | POA: Diagnosis not present

## 2020-09-17 DIAGNOSIS — M170 Bilateral primary osteoarthritis of knee: Secondary | ICD-10-CM | POA: Diagnosis not present

## 2020-10-11 DIAGNOSIS — Z79891 Long term (current) use of opiate analgesic: Secondary | ICD-10-CM | POA: Diagnosis not present

## 2020-10-11 DIAGNOSIS — Z1389 Encounter for screening for other disorder: Secondary | ICD-10-CM | POA: Diagnosis not present

## 2020-10-11 DIAGNOSIS — G894 Chronic pain syndrome: Secondary | ICD-10-CM | POA: Diagnosis not present

## 2020-10-11 DIAGNOSIS — R109 Unspecified abdominal pain: Secondary | ICD-10-CM | POA: Diagnosis not present

## 2020-10-11 DIAGNOSIS — M170 Bilateral primary osteoarthritis of knee: Secondary | ICD-10-CM | POA: Diagnosis not present

## 2020-11-05 DIAGNOSIS — K5903 Drug induced constipation: Secondary | ICD-10-CM | POA: Diagnosis not present

## 2020-11-05 DIAGNOSIS — T402X5A Adverse effect of other opioids, initial encounter: Secondary | ICD-10-CM | POA: Diagnosis not present

## 2020-11-05 DIAGNOSIS — R109 Unspecified abdominal pain: Secondary | ICD-10-CM | POA: Diagnosis not present

## 2020-11-08 DIAGNOSIS — Z1389 Encounter for screening for other disorder: Secondary | ICD-10-CM | POA: Diagnosis not present

## 2020-11-08 DIAGNOSIS — R109 Unspecified abdominal pain: Secondary | ICD-10-CM | POA: Diagnosis not present

## 2020-11-08 DIAGNOSIS — Z79891 Long term (current) use of opiate analgesic: Secondary | ICD-10-CM | POA: Diagnosis not present

## 2020-11-08 DIAGNOSIS — G894 Chronic pain syndrome: Secondary | ICD-10-CM | POA: Diagnosis not present

## 2020-11-08 DIAGNOSIS — M170 Bilateral primary osteoarthritis of knee: Secondary | ICD-10-CM | POA: Diagnosis not present

## 2020-12-13 DIAGNOSIS — R109 Unspecified abdominal pain: Secondary | ICD-10-CM | POA: Diagnosis not present

## 2020-12-13 DIAGNOSIS — M170 Bilateral primary osteoarthritis of knee: Secondary | ICD-10-CM | POA: Diagnosis not present

## 2020-12-13 DIAGNOSIS — K439 Ventral hernia without obstruction or gangrene: Secondary | ICD-10-CM | POA: Diagnosis not present

## 2020-12-13 DIAGNOSIS — G894 Chronic pain syndrome: Secondary | ICD-10-CM | POA: Diagnosis not present

## 2020-12-13 DIAGNOSIS — Z79891 Long term (current) use of opiate analgesic: Secondary | ICD-10-CM | POA: Diagnosis not present

## 2020-12-13 DIAGNOSIS — Z1389 Encounter for screening for other disorder: Secondary | ICD-10-CM | POA: Diagnosis not present

## 2021-01-17 DIAGNOSIS — Z79891 Long term (current) use of opiate analgesic: Secondary | ICD-10-CM | POA: Diagnosis not present

## 2021-01-17 DIAGNOSIS — K439 Ventral hernia without obstruction or gangrene: Secondary | ICD-10-CM | POA: Diagnosis not present

## 2021-01-17 DIAGNOSIS — Z1389 Encounter for screening for other disorder: Secondary | ICD-10-CM | POA: Diagnosis not present

## 2021-01-17 DIAGNOSIS — M170 Bilateral primary osteoarthritis of knee: Secondary | ICD-10-CM | POA: Diagnosis not present

## 2021-01-17 DIAGNOSIS — R109 Unspecified abdominal pain: Secondary | ICD-10-CM | POA: Diagnosis not present

## 2021-01-17 DIAGNOSIS — G894 Chronic pain syndrome: Secondary | ICD-10-CM | POA: Diagnosis not present

## 2021-01-24 DIAGNOSIS — M1712 Unilateral primary osteoarthritis, left knee: Secondary | ICD-10-CM | POA: Diagnosis not present

## 2021-01-24 DIAGNOSIS — M25562 Pain in left knee: Secondary | ICD-10-CM | POA: Diagnosis not present

## 2021-01-24 DIAGNOSIS — G8929 Other chronic pain: Secondary | ICD-10-CM | POA: Diagnosis not present

## 2021-01-30 DIAGNOSIS — Z01818 Encounter for other preprocedural examination: Secondary | ICD-10-CM | POA: Diagnosis not present

## 2021-01-30 DIAGNOSIS — Z79899 Other long term (current) drug therapy: Secondary | ICD-10-CM | POA: Diagnosis not present

## 2021-01-30 DIAGNOSIS — E559 Vitamin D deficiency, unspecified: Secondary | ICD-10-CM | POA: Diagnosis not present

## 2021-01-30 DIAGNOSIS — M79609 Pain in unspecified limb: Secondary | ICD-10-CM | POA: Diagnosis not present

## 2021-02-07 DIAGNOSIS — G894 Chronic pain syndrome: Secondary | ICD-10-CM | POA: Diagnosis not present

## 2021-02-07 DIAGNOSIS — R109 Unspecified abdominal pain: Secondary | ICD-10-CM | POA: Diagnosis not present

## 2021-02-07 DIAGNOSIS — M170 Bilateral primary osteoarthritis of knee: Secondary | ICD-10-CM | POA: Diagnosis not present

## 2021-02-07 DIAGNOSIS — Z79891 Long term (current) use of opiate analgesic: Secondary | ICD-10-CM | POA: Diagnosis not present

## 2021-02-07 DIAGNOSIS — Z1389 Encounter for screening for other disorder: Secondary | ICD-10-CM | POA: Diagnosis not present

## 2021-02-07 DIAGNOSIS — K439 Ventral hernia without obstruction or gangrene: Secondary | ICD-10-CM | POA: Diagnosis not present

## 2021-02-14 ENCOUNTER — Emergency Department (HOSPITAL_COMMUNITY): Payer: Medicare Other

## 2021-02-14 ENCOUNTER — Encounter (HOSPITAL_COMMUNITY): Payer: Self-pay | Admitting: Emergency Medicine

## 2021-02-14 ENCOUNTER — Other Ambulatory Visit: Payer: Self-pay

## 2021-02-14 ENCOUNTER — Inpatient Hospital Stay (HOSPITAL_COMMUNITY)
Admission: EM | Admit: 2021-02-14 | Discharge: 2021-02-19 | DRG: 913 | Disposition: A | Discharge status: Home Health | Payer: Medicare Other | Attending: Surgery | Admitting: Surgery

## 2021-02-14 ENCOUNTER — Inpatient Hospital Stay (HOSPITAL_COMMUNITY): Payer: Medicare Other

## 2021-02-14 DIAGNOSIS — Y92410 Unspecified street and highway as the place of occurrence of the external cause: Secondary | ICD-10-CM | POA: Diagnosis not present

## 2021-02-14 DIAGNOSIS — R079 Chest pain, unspecified: Secondary | ICD-10-CM | POA: Diagnosis not present

## 2021-02-14 DIAGNOSIS — M19032 Primary osteoarthritis, left wrist: Secondary | ICD-10-CM | POA: Diagnosis not present

## 2021-02-14 DIAGNOSIS — I7123 Aneurysm of the descending thoracic aorta, without rupture: Secondary | ICD-10-CM | POA: Diagnosis not present

## 2021-02-14 DIAGNOSIS — I7103 Dissection of thoracoabdominal aorta: Secondary | ICD-10-CM | POA: Diagnosis not present

## 2021-02-14 DIAGNOSIS — T1490XA Injury, unspecified, initial encounter: Secondary | ICD-10-CM | POA: Diagnosis not present

## 2021-02-14 DIAGNOSIS — S59912A Unspecified injury of left forearm, initial encounter: Secondary | ICD-10-CM | POA: Diagnosis not present

## 2021-02-14 DIAGNOSIS — I701 Atherosclerosis of renal artery: Secondary | ICD-10-CM | POA: Diagnosis not present

## 2021-02-14 DIAGNOSIS — S3993XA Unspecified injury of pelvis, initial encounter: Secondary | ICD-10-CM | POA: Diagnosis not present

## 2021-02-14 DIAGNOSIS — Z79890 Hormone replacement therapy (postmenopausal): Secondary | ICD-10-CM | POA: Diagnosis not present

## 2021-02-14 DIAGNOSIS — S2509XA Other specified injury of thoracic aorta, initial encounter: Principal | ICD-10-CM | POA: Diagnosis present

## 2021-02-14 DIAGNOSIS — S3991XA Unspecified injury of abdomen, initial encounter: Secondary | ICD-10-CM | POA: Diagnosis not present

## 2021-02-14 DIAGNOSIS — I10 Essential (primary) hypertension: Secondary | ICD-10-CM | POA: Diagnosis not present

## 2021-02-14 DIAGNOSIS — F17210 Nicotine dependence, cigarettes, uncomplicated: Secondary | ICD-10-CM | POA: Diagnosis present

## 2021-02-14 DIAGNOSIS — S4992XA Unspecified injury of left shoulder and upper arm, initial encounter: Secondary | ICD-10-CM | POA: Diagnosis not present

## 2021-02-14 DIAGNOSIS — R1900 Intra-abdominal and pelvic swelling, mass and lump, unspecified site: Secondary | ICD-10-CM | POA: Diagnosis not present

## 2021-02-14 DIAGNOSIS — S8991XA Unspecified injury of right lower leg, initial encounter: Secondary | ICD-10-CM | POA: Diagnosis not present

## 2021-02-14 DIAGNOSIS — G8929 Other chronic pain: Secondary | ICD-10-CM | POA: Diagnosis present

## 2021-02-14 DIAGNOSIS — S2220XA Unspecified fracture of sternum, initial encounter for closed fracture: Secondary | ICD-10-CM | POA: Diagnosis present

## 2021-02-14 DIAGNOSIS — I351 Nonrheumatic aortic (valve) insufficiency: Secondary | ICD-10-CM | POA: Diagnosis present

## 2021-02-14 DIAGNOSIS — S025XXA Fracture of tooth (traumatic), initial encounter for closed fracture: Secondary | ICD-10-CM | POA: Diagnosis present

## 2021-02-14 DIAGNOSIS — M109 Gout, unspecified: Secondary | ICD-10-CM | POA: Diagnosis present

## 2021-02-14 DIAGNOSIS — Z041 Encounter for examination and observation following transport accident: Secondary | ICD-10-CM | POA: Diagnosis not present

## 2021-02-14 DIAGNOSIS — Z8249 Family history of ischemic heart disease and other diseases of the circulatory system: Secondary | ICD-10-CM | POA: Diagnosis not present

## 2021-02-14 DIAGNOSIS — S8001XA Contusion of right knee, initial encounter: Secondary | ICD-10-CM | POA: Diagnosis not present

## 2021-02-14 DIAGNOSIS — F32A Depression, unspecified: Secondary | ICD-10-CM | POA: Diagnosis present

## 2021-02-14 DIAGNOSIS — I71012 Dissection of descending thoracic aorta: Secondary | ICD-10-CM | POA: Diagnosis present

## 2021-02-14 DIAGNOSIS — Z20822 Contact with and (suspected) exposure to covid-19: Secondary | ICD-10-CM | POA: Diagnosis present

## 2021-02-14 DIAGNOSIS — I7772 Dissection of iliac artery: Secondary | ICD-10-CM | POA: Diagnosis not present

## 2021-02-14 DIAGNOSIS — M4312 Spondylolisthesis, cervical region: Secondary | ICD-10-CM | POA: Diagnosis not present

## 2021-02-14 DIAGNOSIS — M19022 Primary osteoarthritis, left elbow: Secondary | ICD-10-CM | POA: Diagnosis not present

## 2021-02-14 DIAGNOSIS — M7989 Other specified soft tissue disorders: Secondary | ICD-10-CM | POA: Diagnosis not present

## 2021-02-14 DIAGNOSIS — R600 Localized edema: Secondary | ICD-10-CM | POA: Diagnosis not present

## 2021-02-14 DIAGNOSIS — R0602 Shortness of breath: Secondary | ICD-10-CM | POA: Diagnosis not present

## 2021-02-14 DIAGNOSIS — R109 Unspecified abdominal pain: Secondary | ICD-10-CM | POA: Diagnosis not present

## 2021-02-14 DIAGNOSIS — Z791 Long term (current) use of non-steroidal anti-inflammatories (NSAID): Secondary | ICD-10-CM | POA: Diagnosis not present

## 2021-02-14 DIAGNOSIS — S8992XA Unspecified injury of left lower leg, initial encounter: Secondary | ICD-10-CM | POA: Diagnosis not present

## 2021-02-14 DIAGNOSIS — R609 Edema, unspecified: Secondary | ICD-10-CM | POA: Diagnosis not present

## 2021-02-14 DIAGNOSIS — S0990XA Unspecified injury of head, initial encounter: Secondary | ICD-10-CM | POA: Diagnosis not present

## 2021-02-14 DIAGNOSIS — Z79899 Other long term (current) drug therapy: Secondary | ICD-10-CM | POA: Diagnosis not present

## 2021-02-14 HISTORY — DX: Essential (primary) hypertension: I10

## 2021-02-14 HISTORY — DX: Depression, unspecified: F32.A

## 2021-02-14 HISTORY — DX: Other chronic pain: G89.29

## 2021-02-14 LAB — COMPREHENSIVE METABOLIC PANEL
ALT: 35 U/L (ref 0–44)
AST: 64 U/L — ABNORMAL HIGH (ref 15–41)
Albumin: 3.9 g/dL (ref 3.5–5.0)
Alkaline Phosphatase: 134 U/L — ABNORMAL HIGH (ref 38–126)
Anion gap: 14 (ref 5–15)
BUN: 16 mg/dL (ref 8–23)
CO2: 24 mmol/L (ref 22–32)
Calcium: 8.9 mg/dL (ref 8.9–10.3)
Chloride: 99 mmol/L (ref 98–111)
Creatinine, Ser: 0.95 mg/dL (ref 0.61–1.24)
GFR, Estimated: >60 mL/min (ref >60)
Glucose, Bld: 117 mg/dL — ABNORMAL HIGH (ref 70–99)
Potassium: 4 mmol/L (ref 3.5–5.1)
Sodium: 137 mmol/L (ref 135–145)
Total Bilirubin: 0.7 mg/dL (ref 0.3–1.2)
Total Protein: 7.5 g/dL (ref 6.5–8.1)

## 2021-02-14 LAB — I-STAT CHEM 8, ED
BUN: 20 mg/dL (ref 8–23)
Calcium, Ion: 1.13 mmol/L — ABNORMAL LOW (ref 1.15–1.40)
Chloride: 102 mmol/L (ref 98–111)
Creatinine, Ser: 0.9 mg/dL (ref 0.61–1.24)
Glucose, Bld: 116 mg/dL — ABNORMAL HIGH (ref 70–99)
HCT: 47 % (ref 39.0–52.0)
Hemoglobin: 16 g/dL (ref 13.0–17.0)
Potassium: 4 mmol/L (ref 3.5–5.1)
Sodium: 139 mmol/L (ref 135–145)
TCO2: 29 mmol/L (ref 22–32)

## 2021-02-14 LAB — PROTIME-INR
INR: 0.9 (ref 0.8–1.2)
Prothrombin Time: 12.3 seconds (ref 11.4–15.2)

## 2021-02-14 LAB — RESP PANEL BY RT-PCR (FLU A&B, COVID) ARPGX2
Influenza A by PCR: NEGATIVE
Influenza B by PCR: NEGATIVE
SARS Coronavirus 2 by RT PCR: NEGATIVE

## 2021-02-14 LAB — ETHANOL: Alcohol, Ethyl (B): <10 mg/dL (ref <10)

## 2021-02-14 LAB — CBC
HCT: 45.1 % (ref 39.0–52.0)
Hemoglobin: 15.4 g/dL (ref 13.0–17.0)
MCH: 31.4 pg (ref 26.0–34.0)
MCHC: 34.1 g/dL (ref 30.0–36.0)
MCV: 92 fL (ref 80.0–100.0)
Platelets: 216 10*3/uL (ref 150–400)
RBC: 4.9 MIL/uL (ref 4.22–5.81)
RDW: 14.1 % (ref 11.5–15.5)
WBC: 10.9 10*3/uL — ABNORMAL HIGH (ref 4.0–10.5)
nRBC: 0 % (ref 0.0–0.2)

## 2021-02-14 LAB — SAMPLE TO BLOOD BANK

## 2021-02-14 LAB — TROPONIN I (HIGH SENSITIVITY): Troponin I (High Sensitivity): 9 ng/L (ref <18)

## 2021-02-14 MED ORDER — MELATONIN 3 MG PO TABS
3.0000 mg | ORAL_TABLET | Freq: Every evening | ORAL | Status: DC | PRN
  Administered 2021-02-15 – 2021-02-17 (×3): 3 mg via ORAL
  Filled 2021-02-14 (×3): qty 1

## 2021-02-14 MED ORDER — HYDROMORPHONE HCL 1 MG/ML IJ SOLN
1.0000 mg | INTRAMUSCULAR | Status: DC | PRN
  Administered 2021-02-14 – 2021-02-16 (×6): 1 mg via INTRAVENOUS
  Filled 2021-02-14 (×6): qty 1

## 2021-02-14 MED ORDER — ESMOLOL HCL-SODIUM CHLORIDE 2000 MG/100ML IV SOLN
25.0000 ug/kg/min | INTRAVENOUS | Status: DC
  Administered 2021-02-14 (×3): 300 ug/kg/min via INTRAVENOUS
  Administered 2021-02-14: 18:00:00 25 ug/kg/min via INTRAVENOUS
  Administered 2021-02-15: 300 ug/kg/min via INTRAVENOUS
  Administered 2021-02-15: 225 ug/kg/min via INTRAVENOUS
  Administered 2021-02-15: 200 ug/kg/min via INTRAVENOUS
  Administered 2021-02-15: 300 ug/kg/min via INTRAVENOUS
  Administered 2021-02-15: 175 ug/kg/min via INTRAVENOUS
  Administered 2021-02-15: 300 ug/kg/min via INTRAVENOUS
  Administered 2021-02-15: 200 ug/kg/min via INTRAVENOUS
  Administered 2021-02-15: 225 ug/kg/min via INTRAVENOUS
  Administered 2021-02-15: 300 ug/kg/min via INTRAVENOUS
  Administered 2021-02-15: 275 ug/kg/min via INTRAVENOUS
  Administered 2021-02-15: 225 ug/kg/min via INTRAVENOUS
  Administered 2021-02-15 (×2): 300 ug/kg/min via INTRAVENOUS
  Administered 2021-02-15: 200 ug/kg/min via INTRAVENOUS
  Administered 2021-02-15 (×2): 300 ug/kg/min via INTRAVENOUS
  Administered 2021-02-16: 125 ug/kg/min via INTRAVENOUS
  Administered 2021-02-16: 150 ug/kg/min via INTRAVENOUS
  Administered 2021-02-16: 200 ug/kg/min via INTRAVENOUS
  Administered 2021-02-16: 275 ug/kg/min via INTRAVENOUS
  Administered 2021-02-16 (×2): 250 ug/kg/min via INTRAVENOUS
  Administered 2021-02-16: 300 ug/kg/min via INTRAVENOUS
  Administered 2021-02-16: 225 ug/kg/min via INTRAVENOUS
  Administered 2021-02-16: 150 ug/kg/min via INTRAVENOUS
  Administered 2021-02-16: 300 ug/kg/min via INTRAVENOUS
  Administered 2021-02-16: 225 ug/kg/min via INTRAVENOUS
  Administered 2021-02-16: 175 ug/kg/min via INTRAVENOUS
  Administered 2021-02-17 (×3): 225 ug/kg/min via INTRAVENOUS
  Administered 2021-02-17: 200 ug/kg/min via INTRAVENOUS
  Filled 2021-02-14 (×44): qty 100

## 2021-02-14 MED ORDER — ONDANSETRON 4 MG PO TBDP
4.0000 mg | ORAL_TABLET | Freq: Four times a day (QID) | ORAL | Status: DC | PRN
  Administered 2021-02-15: 4 mg via ORAL
  Filled 2021-02-14 (×2): qty 1

## 2021-02-14 MED ORDER — OXYCODONE HCL 5 MG PO TABS
5.0000 mg | ORAL_TABLET | ORAL | Status: DC | PRN
  Administered 2021-02-15 – 2021-02-18 (×9): 5 mg via ORAL
  Filled 2021-02-14 (×9): qty 1

## 2021-02-14 MED ORDER — CLEVIDIPINE BUTYRATE 0.5 MG/ML IV EMUL
0.0000 mg/h | INTRAVENOUS | Status: DC
  Administered 2021-02-14: 23:00:00 17 mg/h via INTRAVENOUS
  Administered 2021-02-14: 21:00:00 16 mg/h via INTRAVENOUS
  Administered 2021-02-14: 18:00:00 1 mg/h via INTRAVENOUS
  Administered 2021-02-15: 14 mg/h via INTRAVENOUS
  Administered 2021-02-15: 1 mg/h via INTRAVENOUS
  Filled 2021-02-14: qty 50
  Filled 2021-02-14: qty 100
  Filled 2021-02-14 (×4): qty 50
  Filled 2021-02-14: qty 100
  Filled 2021-02-14: qty 50

## 2021-02-14 MED ORDER — ENOXAPARIN SODIUM 30 MG/0.3ML IJ SOSY
30.0000 mg | PREFILLED_SYRINGE | Freq: Two times a day (BID) | INTRAMUSCULAR | Status: DC
  Administered 2021-02-16 – 2021-02-19 (×7): 30 mg via SUBCUTANEOUS
  Filled 2021-02-14 (×7): qty 0.3

## 2021-02-14 MED ORDER — SODIUM CHLORIDE 0.9 % IV SOLN: INTRAVENOUS | Status: DC

## 2021-02-14 MED ORDER — CLEVIDIPINE BUTYRATE 0.5 MG/ML IV EMUL: 0.0000 mg/h | INTRAVENOUS | Status: DC

## 2021-02-14 MED ORDER — ONDANSETRON HCL 4 MG/2ML IJ SOLN: 4.0000 mg | Freq: Four times a day (QID) | INTRAMUSCULAR | Status: DC | PRN

## 2021-02-14 MED ORDER — IOHEXOL 300 MG/ML  SOLN
100.0000 mL | Freq: Once | INTRAMUSCULAR | Status: AC | PRN
  Administered 2021-02-14: 18:00:00 100 mL via INTRAVENOUS

## 2021-02-14 MED ORDER — ACETAMINOPHEN 325 MG PO TABS
650.0000 mg | ORAL_TABLET | ORAL | Status: DC | PRN
  Administered 2021-02-17 – 2021-02-18 (×4): 650 mg via ORAL
  Filled 2021-02-14 (×4): qty 2

## 2021-02-14 MED ORDER — HYDROMORPHONE HCL 1 MG/ML IJ SOLN
1.0000 mg | Freq: Once | INTRAMUSCULAR | Status: AC
  Administered 2021-02-14: 16:00:00 1 mg via INTRAVENOUS
  Filled 2021-02-14: qty 1

## 2021-02-14 MED ORDER — DOCUSATE SODIUM 100 MG PO CAPS
100.0000 mg | ORAL_CAPSULE | Freq: Two times a day (BID) | ORAL | Status: DC
  Administered 2021-02-14 – 2021-02-19 (×10): 100 mg via ORAL
  Filled 2021-02-14 (×10): qty 1

## 2021-02-14 MED ORDER — SODIUM CHLORIDE 0.9 % IV SOLN: INTRAVENOUS | Status: DC | PRN

## 2021-02-14 NOTE — ED Notes (Signed)
At this time we are maxed on esmolol and cleviprex.
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
## 2021-02-14 NOTE — ED Notes (Signed)
Esmolol  drip had run out when RN entered the room. New bag has been ordered from pharmacy
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
## 2021-02-14 NOTE — ED Notes (Signed)
Spoke with grandson, which notified wife and son.  Family is on the way Pt transported to xray and CT

## 2021-02-14 NOTE — ED Notes (Signed)
C-collar applied

## 2021-02-14 NOTE — ED Notes (Signed)
Vascular MD wants HR <80 and SBP <120
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
## 2021-02-14 NOTE — ED Notes (Signed)
Pharmacy called about the esmolol to put the order as stat

## 2021-02-14 NOTE — ED Notes (Signed)
BP is equal in bilateral arms
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
## 2021-02-15 DIAGNOSIS — I71012 Dissection of descending thoracic aorta: Secondary | ICD-10-CM

## 2021-02-15 DIAGNOSIS — I7772 Dissection of iliac artery: Secondary | ICD-10-CM

## 2021-02-15 LAB — BASIC METABOLIC PANEL
Anion gap: 8 (ref 5–15)
Anion gap: 8 (ref 5–15)
BUN: 15 mg/dL (ref 8–23)
BUN: 12 mg/dL (ref 8–23)
CO2: 24 mmol/L (ref 22–32)
CO2: 24 mmol/L (ref 22–32)
Calcium: 8.2 mg/dL — ABNORMAL LOW (ref 8.9–10.3)
Calcium: 8.5 mg/dL — ABNORMAL LOW (ref 8.9–10.3)
Chloride: 104 mmol/L (ref 98–111)
Chloride: 104 mmol/L (ref 98–111)
Creatinine, Ser: 0.98 mg/dL (ref 0.61–1.24)
Creatinine, Ser: 1 mg/dL (ref 0.61–1.24)
GFR, Estimated: >60 mL/min (ref >60)
GFR, Estimated: >60 mL/min (ref >60)
Glucose, Bld: 109 mg/dL — ABNORMAL HIGH (ref 70–99)
Glucose, Bld: 124 mg/dL — ABNORMAL HIGH (ref 70–99)
Potassium: 4.7 mmol/L (ref 3.5–5.1)
Potassium: 4.5 mmol/L (ref 3.5–5.1)
Sodium: 136 mmol/L (ref 135–145)
Sodium: 136 mmol/L (ref 135–145)

## 2021-02-15 LAB — MRSA NEXT GEN BY PCR, NASAL: MRSA by PCR Next Gen: NOT DETECTED

## 2021-02-15 LAB — CBC
HCT: 39.7 % (ref 39.0–52.0)
Hemoglobin: 13.2 g/dL (ref 13.0–17.0)
MCH: 31.2 pg (ref 26.0–34.0)
MCHC: 33.2 g/dL (ref 30.0–36.0)
MCV: 93.9 fL (ref 80.0–100.0)
Platelets: 192 10*3/uL (ref 150–400)
RBC: 4.23 MIL/uL (ref 4.22–5.81)
RDW: 14.4 % (ref 11.5–15.5)
WBC: 6.6 10*3/uL (ref 4.0–10.5)
nRBC: 0 % (ref 0.0–0.2)

## 2021-02-15 LAB — LACTIC ACID, PLASMA: Lactic Acid, Venous: 1.9 mmol/L (ref 0.5–1.9)

## 2021-02-15 LAB — TROPONIN I (HIGH SENSITIVITY): Troponin I (High Sensitivity): 8 ng/L (ref <18)

## 2021-02-15 MED ORDER — CHLORHEXIDINE GLUCONATE CLOTH 2 % EX PADS
6.0000 | MEDICATED_PAD | Freq: Every day | CUTANEOUS | Status: DC
  Administered 2021-02-15 – 2021-02-18 (×4): 6 via TOPICAL

## 2021-02-15 MED ORDER — METOPROLOL SUCCINATE ER 25 MG PO TB24
25.0000 mg | ORAL_TABLET | Freq: Every day | ORAL | Status: DC
  Administered 2021-02-15 – 2021-02-16 (×2): 25 mg via ORAL
  Filled 2021-02-15 (×2): qty 1
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
## 2021-02-15 NOTE — Progress Notes (Addendum)
[facility]  CT scan left elbow reviewed.  No acute fractures identified.  Chronic/degenerative changes noted.  No role for orthopedic consultation at this point  No restrictions to left upper extremity.  Patient may use left arm to mobilize as needed.  Please contact orthopedics with any additional issues as they arise  Jari Pigg, PA-C [phone] (C) 02/15/2021, 12:42 PM  [facility] [place] [address] [phone] (F)    Patient ID: Julian Parker, male   DOB: [birth year], 80 y.o.   MRN: [id number]
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
## 2021-02-16 ENCOUNTER — Inpatient Hospital Stay (HOSPITAL_COMMUNITY): Payer: Medicare Other

## 2021-02-16 ENCOUNTER — Encounter (HOSPITAL_COMMUNITY): Payer: Self-pay

## 2021-02-16 DIAGNOSIS — I10 Essential (primary) hypertension: Secondary | ICD-10-CM

## 2021-02-16 DIAGNOSIS — I71012 Dissection of descending thoracic aorta: Secondary | ICD-10-CM

## 2021-02-16 DIAGNOSIS — I7772 Dissection of iliac artery: Secondary | ICD-10-CM

## 2021-02-16 DIAGNOSIS — I7103 Dissection of thoracoabdominal aorta: Secondary | ICD-10-CM | POA: Diagnosis not present

## 2021-02-16 LAB — CBC
HCT: 36.9 % — ABNORMAL LOW (ref 39.0–52.0)
Hemoglobin: 12.5 g/dL — ABNORMAL LOW (ref 13.0–17.0)
MCH: 31.8 pg (ref 26.0–34.0)
MCHC: 33.9 g/dL (ref 30.0–36.0)
MCV: 93.9 fL (ref 80.0–100.0)
Platelets: 156 10*3/uL (ref 150–400)
RBC: 3.93 MIL/uL — ABNORMAL LOW (ref 4.22–5.81)
RDW: 14.1 % (ref 11.5–15.5)
WBC: 6.9 10*3/uL (ref 4.0–10.5)
nRBC: 0 % (ref 0.0–0.2)

## 2021-02-16 LAB — BASIC METABOLIC PANEL
Anion gap: 9 (ref 5–15)
BUN: 11 mg/dL (ref 8–23)
CO2: 21 mmol/L — ABNORMAL LOW (ref 22–32)
Calcium: 8.2 mg/dL — ABNORMAL LOW (ref 8.9–10.3)
Chloride: 107 mmol/L (ref 98–111)
Creatinine, Ser: 0.87 mg/dL (ref 0.61–1.24)
GFR, Estimated: >60 mL/min (ref >60)
Glucose, Bld: 107 mg/dL — ABNORMAL HIGH (ref 70–99)
Potassium: 4 mmol/L (ref 3.5–5.1)
Sodium: 137 mmol/L (ref 135–145)

## 2021-02-16 LAB — TRIGLYCERIDES: Triglycerides: 319 mg/dL — ABNORMAL HIGH (ref <150)

## 2021-02-16 MED ORDER — HYDRALAZINE HCL 25 MG PO TABS
25.0000 mg | ORAL_TABLET | Freq: Three times a day (TID) | ORAL | Status: DC
  Administered 2021-02-16 (×3): 25 mg via ORAL
  Filled 2021-02-16 (×3): qty 1

## 2021-02-16 MED ORDER — HYDRALAZINE HCL 20 MG/ML IJ SOLN
10.0000 mg | INTRAMUSCULAR | Status: DC | PRN
  Administered 2021-02-16: 10 mg via INTRAVENOUS
  Filled 2021-02-16: qty 1

## 2021-02-16 MED ORDER — IOHEXOL 350 MG/ML SOLN
100.0000 mL | Freq: Once | INTRAVENOUS | Status: AC | PRN
  Administered 2021-02-16: 100 mL via INTRAVENOUS

## 2021-02-16 MED ORDER — HYDRALAZINE HCL 50 MG PO TABS
100.0000 mg | ORAL_TABLET | Freq: Three times a day (TID) | ORAL | Status: DC
  Administered 2021-02-17 – 2021-02-18 (×4): 100 mg via ORAL
  Filled 2021-02-16 (×4): qty 2

## 2021-02-16 MED ORDER — METOPROLOL TARTRATE 25 MG PO TABS
25.0000 mg | ORAL_TABLET | Freq: Three times a day (TID) | ORAL | Status: AC
  Administered 2021-02-16 – 2021-02-18 (×7): 25 mg via ORAL
  Filled 2021-02-16 (×7): qty 1
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
## 2021-02-16 NOTE — Progress Notes (Signed)
TRN on unit and talked with patients primary RN. Primary RN states pt is doing good, but his systolic BP is not under 782. Primary RN stated pt is on PO hydralazine and PO metoprolol as well as being maxed-out on his esmolol gtt. Pharmacy was contacted who is recommending his PO dose of hydralazine increase and for him to have a PRN dose of IV hydralazine. Dr. Donne Hazel notified who stated cardiology is managing the blood pressure and will need to be paged. Primary RN informed of this and that she will need to page cardiology orders/order changes due to patients blood pressure.  Prior to TRN leaving the [facility] unit, TRN offered any other assistance to the primary RN. Primary RN declined any other needs and stated she has paged cardiology ([phone]).

## 2021-02-16 NOTE — Care Management (Signed)
°  Transition of Care (TOC) Screening Note     Transition of Care (TOC) CM/SW Contact:    Emilo Gras RN      Transition of Care Department [facility]) has reviewed patient we will continue to monitor patient advancement through interdisciplinary progression rounds.
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
## 2021-02-17 ENCOUNTER — Inpatient Hospital Stay (HOSPITAL_COMMUNITY): Payer: Medicare Other

## 2021-02-17 DIAGNOSIS — I10 Essential (primary) hypertension: Secondary | ICD-10-CM | POA: Diagnosis not present

## 2021-02-17 DIAGNOSIS — I7103 Dissection of thoracoabdominal aorta: Secondary | ICD-10-CM | POA: Diagnosis not present

## 2021-02-17 DIAGNOSIS — I7772 Dissection of iliac artery: Secondary | ICD-10-CM

## 2021-02-17 DIAGNOSIS — R079 Chest pain, unspecified: Secondary | ICD-10-CM

## 2021-02-17 DIAGNOSIS — I71012 Dissection of descending thoracic aorta: Secondary | ICD-10-CM

## 2021-02-17 LAB — ECHOCARDIOGRAM COMPLETE
AR max vel: 2.1 cm2
AV Area VTI: 2.2 cm2
AV Area mean vel: 2.21 cm2
AV Mean grad: 3.4 mmHg
AV Peak grad: 7.7 mmHg
Ao pk vel: 1.39 m/s
Area-P 1/2: 2.34 cm2
Height: 65 in
Weight: 3200 oz

## 2021-02-17 MED ORDER — METHOCARBAMOL 500 MG PO TABS
750.0000 mg | ORAL_TABLET | Freq: Three times a day (TID) | ORAL | Status: DC | PRN
  Administered 2021-02-17: 750 mg via ORAL
  Filled 2021-02-17: qty 2

## 2021-02-17 MED ORDER — HYDROMORPHONE HCL 1 MG/ML IJ SOLN: 0.5000 mg | INTRAMUSCULAR | Status: DC | PRN
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
## 2021-02-17 NOTE — Progress Notes (Signed)
Subjective:  Denies SSCP, palpitations or Dyspnea   Objective:  Vitals:   02/17/21 0645 02/17/21 0700 02/17/21 0726 02/17/21 0821  BP: 131/72 135/90  (!) 142/81  Pulse: (!) 58 68  63  Resp: 12 (!) 22    Temp:   98.5 F (36.9 C)   TempSrc:   Oral   SpO2: 93% 95%    Weight:      Height:        Intake/Output from previous day:  Intake/Output Summary (Last 24 hours) at 02/17/2021 0834 Last data filed at 02/17/2021 0700 Gross per 24 hour  Intake 1375.39 ml  Output 225 ml  Net 1150.39 ml    Physical Exam: Hispanic male No murmur Lungs clear Mild sternal tenderness Palpable pedal pulses   Lab Results: Basic Metabolic Panel: Recent Labs    02/15/21 1002 02/16/21 0459  NA 136 137  K 4.5 4.0  CL 104 107  CO2 24 21*  GLUCOSE 124* 107*  BUN 12 11  CREATININE 1.00 0.87  CALCIUM 8.5* 8.2*   Liver Function Tests: Recent Labs    02/14/21 1530  AST 64*  ALT 35  ALKPHOS 134*  BILITOT 0.7  PROT 7.5  ALBUMIN 3.9   No results for input(s): LIPASE, AMYLASE in the last 72 hours. CBC: Recent Labs    02/15/21 0500 02/16/21 0459  WBC 6.6 6.9  HGB 13.2 12.5*  HCT 39.7 36.9*  MCV 93.9 93.9  PLT 192 156    Fasting Lipid Panel: Recent Labs    02/16/21 0459  TRIG 319*    Imaging: CT Angio Chest/Abd/Pel for Dissection W and/or W/WO  Result Date: 02/16/2021 CLINICAL DATA:  Thoracic aortic dissection, follow up EXAM: CT ANGIOGRAPHY CHEST, ABDOMEN AND PELVIS TECHNIQUE: Non-contrast CT of the chest was initially obtained. Multidetector CT imaging through the chest, abdomen and pelvis was performed using the standard protocol during bolus administration of intravenous contrast. Multiplanar reconstructed images and MIPs were obtained and reviewed to evaluate the vascular anatomy. CONTRAST:  126mL OMNIPAQUE IOHEXOL 350 MG/ML SOLN COMPARISON:  February 14, 2021 FINDINGS: CTA CHEST Cardiovascular: Preferential opacification of the thoracic aorta. Again seen is the type B  aortic dissection arising just distal to the origin of the left subclavian artery and continuing past the diaphragmatic hiatus into the abdominal aorta, detailed below. The proximal descending thoracic aorta reaches a maximum diameter of approximately 4.2 cm, with the more distal descending thoracic aorta reaching a maximum diameter of approximately 5.0 cm, which have remained stable since previous exam. No significant interval change in the morphology of the false lumen in the thoracic aorta which remains perfused. There is moderate to severe narrowing of the true lumen in the more distal aspect of the descending thoracic aorta as it approaches the diaphragmatic hiatus, again similar in appearance. Normal heart size. No pericardial effusion. Mediastinum/Nodes: There are a couple of prominent paratracheal lymph nodes which measure at the upper limit of normal, slightly more pronounced on today's exam, favored reactive in the setting of recent trauma. The thyroid gland appears normal. Lungs/Pleura: No pleural effusion. No pneumothorax. Subsegmental atelectasis in the right lung base, with passive/compressive subsegmental atelectasis in the left medial lower lobe. No suspicious pulmonary nodules. CTA ABDOMEN AND PELVIS VASCULAR Aorta: There is continuation of the type B aortic dissection throughout the abdominal aorta extending into the proximal left common iliac artery where it terminates. The false lumen of the dissection in the abdominal aorta appears largely thrombosed or has slow flow  with a fenestration in the left common iliac artery which supplies the more distal aspect of the false lumen. This again remains similar in morphology to previous exam. The infrarenal abdominal aorta remains borderline aneurysmal up to 3 cm, again stable. Celiac: Patent and arising from the true lumen. No significant stenosis. SMA: Patent and arising from the true lumen. No significant stenosis. IMA: Patent and arising from the true  lumen. No significant stenosis Renals: The bilateral main renal arteries arise from the true lumen and are patent. Calcific atherosclerosis results in mild to moderate ostial stenosis of the right renal artery. There are additional bilateral accessory renal arteries which appear to arise from the false lumen but remain patent. Inflow: The dissection flap extends into the proximal left common iliac artery. The right common iliac artery does not appear involved. The iliac and visualized femoral arteries are patent without significant stenosis. Veins: No obvious venous abnormality within the limitations of this arterial phase study. NON-VASCULAR Hepatobiliary: The liver is normal in size without focal abnormality. No intrahepatic or extrahepatic biliary ductal dilation. The gallbladder appears normal. Spleen: Normal in size without focal abnormality. Pancreas: No pancreatic ductal dilatation or surrounding inflammatory changes. Adrenals/Urinary Tract: Adrenal glands are unremarkable. The kidneys are normal in size without hydronephrosis. Small right renal cyst. Bladder is unremarkable. Stomach/Bowel: The stomach, small bowel and large bowel are normal in caliber without abnormal wall thickening or surrounding inflammatory changes. There is a large right abdominal sidewall hernia containing loops of small and large bowel without evidence of incarceration or strangulation. Reproductive: Prostate is unremarkable. Lymphatic: No enlarged lymph nodes in the abdomen or pelvis. Other: No abdominopelvic ascites. Musculoskeletal: Suspected nondisplaced mid sternal fracture again seen. Degenerative changes of the upper lumbar spine. The soft tissues are unremarkable. Review of the MIP images confirms the above findings. IMPRESSION: 1. Overall stable appearance of uncomplicated type B aortic dissection extending from just distal to the left subclavian artery origin to the proximal left common iliac artery. Stable aneurysmal  dilation of the distal descending thoracic aorta up to 5 cm, and borderline aneurysmal dilation of the infrarenal abdominal aorta up to 3 cm. No imaging findings of malperfusion or end organ ischemia. 2. Suspected nondisplaced mid sternal fracture again seen. Electronically Signed   By: Albin Felling M.D.   On: 02/16/2021 12:58    Cardiac Studies:  ECG: SR rate 82 normal   Telemetry: NSR 02/17/2021   Echo: pending   Medications:    Chlorhexidine Gluconate Cloth  6 each Topical Daily   docusate sodium  100 mg Oral BID   enoxaparin (LOVENOX) injection  30 mg Subcutaneous Q12H   hydrALAZINE  100 mg Oral TID   metoprolol tartrate  25 mg Oral TID      sodium chloride     clevidipine Stopped (02/15/21 1855)   esmolol Stopped (02/17/21 0610)    Assessment/Plan:   HTN:  iv meds d/c hydralazine increased to 100 mg tid ? By VVS HR beter on lopressor  TTE pending to assess EF/LVH and aortic valve  Type B dissection  stable by f/u CTA yesterday  Sternal Fracture:  non displaced stable    Jenkins Rouge 02/17/2021, 8:34 AM
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
## 2021-02-17 NOTE — TOC CAGE-AID Note (Signed)
Transition of Care [facility]) - CAGE-AID Screening   Patient Details  Name: Julian Parker MRN: [id number] Date of Birth: [birth year]  Transition [facility]) CM/SW Contact:    [name], [place] Phone Number: 02/17/2021, 2:00 PM   Clinical Narrative: Pt participated in [place].  Pt stated he does not use substance or ETOH.  Pt smokes cigarettes.  Pt was offered resources, due to smoking cigarettes.  [name], MSW, LCSW-A Pronouns:  She/Her/Hers [place] Transitions of Care Clinical Social Worker Direct Number:  [phone] [name]    CAGE-AID Screening:    Have You Ever Felt You Ought to Cut Down on Your Drinking or Drug Use?: No Have People Annoyed You By [employer] Your Drinking Or Drug Use?: No Have You Felt Bad Or Guilty About Your Drinking Or Drug Use?: No Have You Ever Had a Drink or Used Drugs First Thing In The Morning to Steady Your Nerves or to Get Rid of a Hangover?: No CAGE-AID Score: 0  Substance Abuse Education Offered: Yes  Substance abuse interventions: Scientist, clinical (histocompatibility and immunogenetics)

## 2021-02-17 NOTE — Progress Notes (Signed)
Echocardiogram 2D Echocardiogram has been performed.  Oneal Deputy Laramie Meissner RDCS 02/17/2021, 11:45 AM
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
## 2021-02-18 DIAGNOSIS — I7772 Dissection of iliac artery: Secondary | ICD-10-CM

## 2021-02-18 DIAGNOSIS — I71012 Dissection of descending thoracic aorta: Secondary | ICD-10-CM

## 2021-02-18 DIAGNOSIS — I7103 Dissection of thoracoabdominal aorta: Secondary | ICD-10-CM | POA: Diagnosis not present

## 2021-02-18 DIAGNOSIS — I10 Essential (primary) hypertension: Secondary | ICD-10-CM | POA: Diagnosis not present

## 2021-02-18 MED ORDER — CARVEDILOL 12.5 MG PO TABS
12.5000 mg | ORAL_TABLET | Freq: Two times a day (BID) | ORAL | Status: DC
  Administered 2021-02-18 – 2021-02-19 (×2): 12.5 mg via ORAL
  Filled 2021-02-18 (×2): qty 1

## 2021-02-18 MED ORDER — OXYCODONE HCL 5 MG PO TABS
5.0000 mg | ORAL_TABLET | ORAL | Status: DC | PRN
  Administered 2021-02-18 (×2): 5 mg via ORAL
  Filled 2021-02-18 (×3): qty 1

## 2021-02-18 MED ORDER — ACETAMINOPHEN 500 MG PO TABS
1000.0000 mg | ORAL_TABLET | Freq: Four times a day (QID) | ORAL | Status: DC
  Administered 2021-02-18 – 2021-02-19 (×4): 1000 mg via ORAL
  Filled 2021-02-18 (×5): qty 2

## 2021-02-18 MED ORDER — IRBESARTAN 75 MG PO TABS
37.5000 mg | ORAL_TABLET | Freq: Every day | ORAL | Status: DC
  Administered 2021-02-18 – 2021-02-19 (×2): 37.5 mg via ORAL
  Filled 2021-02-18 (×2): qty 0.5

## 2021-02-18 MED ORDER — HYDROMORPHONE HCL 1 MG/ML IJ SOLN: 0.5000 mg | INTRAMUSCULAR | Status: DC | PRN

## 2021-02-18 MED ORDER — HYDRALAZINE HCL 25 MG PO TABS
25.0000 mg | ORAL_TABLET | Freq: Three times a day (TID) | ORAL | Status: DC
  Administered 2021-02-18 – 2021-02-19 (×3): 25 mg via ORAL
  Filled 2021-02-18 (×3): qty 1

## 2021-02-18 MED ORDER — POLYETHYLENE GLYCOL 3350 17 G PO PACK
17.0000 g | PACK | Freq: Every day | ORAL | Status: DC
  Administered 2021-02-18: 17 g via ORAL
  Filled 2021-02-18: qty 1
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
## 2021-02-18 NOTE — Progress Notes (Signed)
Mobility Specialist: Progress Note   02/18/21 1121  Mobility  Activity Ambulated in hall  Level of Assistance Minimal assist, patient does 75% or more  Assistive Device Front wheel walker  Distance Ambulated (ft) 470 ft  Mobility Ambulated with assistance in hallway  Mobility Response Tolerated well  Mobility performed by Mobility specialist  Bed Position Chair  $Mobility charge 1 Mobility   Pre-Mobility: 71 HR, 96% SpO2 Post-Mobility: 77 HR, 97% SpO2  Pt required minA to sit EOB as well as to stand, no c/o throughout ambulation. Pt to recliner after walk with call bell in reach and family member present in the room.   [facility] Brei [place] Mobility Specialist Mobility Specialist 4 [place]: [phone] Mobility Specialist 2 [place]: [phone]
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
## 2021-02-18 NOTE — Progress Notes (Signed)
Progress Note  Patient Name: Julian Parker Date of Encounter: 02/18/2021  [facility] Cardiologist: Jenkins Rouge, MD   Subjective   Patient continues to have chest pain, but reports that it has improved over the past few days. Reports mild dizziness. Denies trouble breathing, headache, palpitations.   Inpatient Medications    Scheduled Meds:  Chlorhexidine Gluconate Cloth  6 each Topical Daily   docusate sodium  100 mg Oral BID   enoxaparin (LOVENOX) injection  30 mg Subcutaneous Q12H   hydrALAZINE  100 mg Oral TID   metoprolol tartrate  25 mg Oral TID   polyethylene glycol  17 g Oral Daily   Continuous Infusions:  sodium chloride     PRN Meds: Place/Maintain arterial line **AND** sodium chloride, acetaminophen, hydrALAZINE, HYDROmorphone (DILAUDID) injection, melatonin, methocarbamol, ondansetron **OR** ondansetron (ZOFRAN) IV, oxyCODONE   Vital Signs    Vitals:   02/17/21 2100 02/18/21 0030 02/18/21 0417 02/18/21 0838  BP: 119/60 (!) 93/50 104/63 122/64  Pulse: 77 71 69 76  Resp: (!) 24 18 18 17   Temp: 98.3 F (36.8 C) 98.1 F (36.7 C) (!) 97.3 F (36.3 C) 97.8 F (36.6 C)  TempSrc: Oral Oral Oral Oral  SpO2: 93% 95% 94% 95%  Weight:      Height:        Intake/Output Summary (Last 24 hours) at 02/18/2021 0905 Last data filed at 02/18/2021 0839 Gross per 24 hour  Intake 680 ml  Output --  Net 680 ml   Last 3 Weights 02/14/2021  Weight (lbs) 200 lb  Weight (kg) 90.719 kg      Telemetry    Sinus rhythm, HR consistently under 85 BPM - Personally Reviewed  ECG    No new tracings  - Personally Reviewed  Physical Exam   GEN: No acute distress.   Neck: No JVD Cardiac: RRR, no murmurs, rubs, or gallops. Tenderness to palpation over the sternum  Respiratory: Clear to auscultation bilaterally. GI: Soft, nontender, non-distended  MS: Significant edema in the right upper extremity with blistering noted. Radial pulses 2+ bilaterally  Neuro:  Nonfocal   Psych: Normal affect   Labs    High Sensitivity Troponin:   Recent Labs  Lab 02/14/21 1530 02/15/21 0448  TROPONINIHS 9 8     Chemistry Recent Labs  Lab 02/14/21 1530 02/14/21 1558 02/15/21 0500 02/15/21 1002 02/16/21 0459  NA 137   < > 136 136 137  K 4.0   < > 4.7 4.5 4.0  CL 99   < > 104 104 107  CO2 24  --  24 24 21*  GLUCOSE 117*   < > 109* 124* 107*  BUN 16   < > 15 12 11   CREATININE 0.95   < > 0.98 1.00 0.87  CALCIUM 8.9  --  8.2* 8.5* 8.2*  PROT 7.5  --   --   --   --   ALBUMIN 3.9  --   --   --   --   AST 64*  --   --   --   --   ALT 35  --   --   --   --   ALKPHOS 134*  --   --   --   --   BILITOT 0.7  --   --   --   --   GFRNONAA >60  --  >60 >60 >60  ANIONGAP 14  --  8 8 9    < > = values  in this interval not displayed.    Lipids  Recent Labs  Lab 02/16/21 0459  TRIG 319*    Hematology Recent Labs  Lab 02/14/21 1530 02/14/21 1558 02/15/21 0500 02/16/21 0459  WBC 10.9*  --  6.6 6.9  RBC 4.90  --  4.23 3.93*  HGB 15.4 16.0 13.2 12.5*  HCT 45.1 47.0 39.7 36.9*  MCV 92.0  --  93.9 93.9  MCH 31.4  --  31.2 31.8  MCHC 34.1  --  33.2 33.9  RDW 14.1  --  14.4 14.1  PLT 216  --  192 156   Thyroid No results for input(s): TSH, FREET4 in the last 168 hours.  BNPNo results for input(s): BNP, PROBNP in the last 168 hours.  DDimer No results for input(s): DDIMER in the last 168 hours.   Radiology    ECHOCARDIOGRAM COMPLETE  Result Date: 02/17/2021    ECHOCARDIOGRAM REPORT   Patient Name:   [name] Date of Exam: 02/17/2021 Medical Rec #:  [id number]       Height:       65.0 in Accession #:    [id number]      Weight:       200.0 lb Date of Birth:  1940/02/22       BSA:          1.978 m Patient Age:    40 years        BP:           106/62 mmHg Patient Gender: M               HR:           61 bpm. Exam Location:  Inpatient Procedure: 2D Echo, Color Doppler and Cardiac Doppler Indications:    R07.9* Chest pain, unspecified  History:        Patient has  no prior history of Echocardiogram examinations.                 Risk Factors:Hypertension. Type B Aortic Dissection, distal arch                 to iliac.  Sonographer:    Raquel Sarna Senior RDCS Referring Phys: [place] Comments: Technically difficult due to sternal fracture, scanned supine. IMPRESSIONS  1. Left ventricular ejection fraction, by estimation, is 60 to 65%. The left ventricle has normal function. Left ventricular endocardial border not optimally defined to evaluate regional wall motion. Left ventricular diastolic parameters are consistent with Grade I diastolic dysfunction (impaired relaxation).  2. Right ventricular systolic function is normal. The right ventricular size is normal. Tricuspid regurgitation signal is inadequate for assessing PA pressure.  3. The mitral valve was not well visualized. No evidence of mitral valve regurgitation. No evidence of mitral stenosis.  4. The aortic valve was not well visualized. Aortic valve regurgitation is mild to moderate. No aortic stenosis is present.  5. The inferior vena cava is dilated in size with <50% respiratory variability, suggesting right atrial pressure of 15 mmHg. FINDINGS  Left Ventricle: Left ventricular ejection fraction, by estimation, is 60 to 65%. The left ventricle has normal function. Left ventricular endocardial border not optimally defined to evaluate regional wall motion. The left ventricular internal cavity size was normal in size. Suboptimal image quality limits for assessment of left ventricular hypertrophy. Left ventricular diastolic parameters are consistent with Grade I diastolic dysfunction (impaired relaxation). Normal left ventricular filling pressure. Right Ventricle: The right ventricular size  is normal. No increase in right ventricular wall thickness. Right ventricular systolic function is normal. Tricuspid regurgitation signal is inadequate for assessing PA pressure. Left Atrium: Left atrial size was  normal in size. Right Atrium: Right atrial size was normal in size. Pericardium: There is no evidence of pericardial effusion. Mitral Valve: The mitral valve was not well visualized. No evidence of mitral valve regurgitation. No evidence of mitral valve stenosis. Tricuspid Valve: The tricuspid valve is normal in structure. Tricuspid valve regurgitation is not demonstrated. No evidence of tricuspid stenosis. Aortic Valve: The aortic valve was not well visualized. Aortic valve regurgitation is mild to moderate. No aortic stenosis is present. Aortic valve mean gradient measures 3.4 mmHg. Aortic valve peak gradient measures 7.7 mmHg. Aortic valve area, by VTI measures 2.20 cm. Pulmonic Valve: The pulmonic valve was not well visualized. Pulmonic valve regurgitation is not visualized. No evidence of pulmonic stenosis. Aorta: The aortic root was not well visualized and the ascending aorta was not well visualized. Venous: The inferior vena cava is dilated in size with less than 50% respiratory variability, suggesting right atrial pressure of 15 mmHg. IAS/Shunts: The interatrial septum was not well visualized.  LEFT VENTRICLE PLAX 2D LVOT diam:     2.00 cm   Diastology LV SV:         65        LV e' medial:    4.46 cm/s LV SV Index:   33        LV E/e' medial:  13.3 LVOT Area:     3.14 cm  LV e' lateral:   6.09 cm/s                          LV E/e' lateral: 9.7  LEFT ATRIUM             Index        RIGHT ATRIUM           Index LA Vol (A2C):   42.8 ml 21.64 ml/m  RA Area:     13.30 cm LA Vol (A4C):   55.5 ml 28.06 ml/m  RA Volume:   32.00 ml  16.18 ml/m LA Biplane Vol: 51.8 ml 26.19 ml/m  AORTIC VALVE AV Area (Vmax):    2.10 cm AV Area (Vmean):   2.21 cm AV Area (VTI):     2.20 cm AV Vmax:           138.60 cm/s AV Vmean:          84.201 cm/s AV VTI:            0.294 m AV Peak Grad:      7.7 mmHg AV Mean Grad:      3.4 mmHg LVOT Vmax:         92.60 cm/s LVOT Vmean:        59.300 cm/s LVOT VTI:          0.206 m  LVOT/AV VTI ratio: 0.70 MITRAL VALVE MV Area (PHT): 2.34 cm    SHUNTS MV Decel Time: 324 msec    Systemic VTI:  0.21 m MV E velocity: 59.20 cm/s  Systemic Diam: 2.00 cm MV A velocity: 73.90 cm/s MV E/A ratio:  0.80 Carlyle Dolly MD Electronically signed by Carlyle Dolly MD Signature Date/Time: 02/17/2021/12:38:29 PM    Final    CT Angio Chest/Abd/Pel for Dissection W and/or W/WO  Result Date: 02/16/2021 CLINICAL DATA:  Thoracic aortic dissection, follow up  EXAM: CT ANGIOGRAPHY CHEST, ABDOMEN AND PELVIS TECHNIQUE: Non-contrast CT of the chest was initially obtained. Multidetector CT imaging through the chest, abdomen and pelvis was performed using the standard protocol during bolus administration of intravenous contrast. Multiplanar reconstructed images and MIPs were obtained and reviewed to evaluate the vascular anatomy. CONTRAST:  161mL OMNIPAQUE IOHEXOL 350 MG/ML SOLN COMPARISON:  February 14, 2021 FINDINGS: CTA CHEST Cardiovascular: Preferential opacification of the thoracic aorta. Again seen is the type B aortic dissection arising just distal to the origin of the left subclavian artery and continuing past the diaphragmatic hiatus into the abdominal aorta, detailed below. The proximal descending thoracic aorta reaches a maximum diameter of approximately 4.2 cm, with the more distal descending thoracic aorta reaching a maximum diameter of approximately 5.0 cm, which have remained stable since previous exam. No significant interval change in the morphology of the false lumen in the thoracic aorta which remains perfused. There is moderate to severe narrowing of the true lumen in the more distal aspect of the descending thoracic aorta as it approaches the diaphragmatic hiatus, again similar in appearance. Normal heart size. No pericardial effusion. Mediastinum/Nodes: There are a couple of prominent paratracheal lymph nodes which measure at the upper limit of normal, slightly more pronounced on today's exam,  favored reactive in the setting of recent trauma. The thyroid gland appears normal. Lungs/Pleura: No pleural effusion. No pneumothorax. Subsegmental atelectasis in the right lung base, with passive/compressive subsegmental atelectasis in the left medial lower lobe. No suspicious pulmonary nodules. CTA ABDOMEN AND PELVIS VASCULAR Aorta: There is continuation of the type B aortic dissection throughout the abdominal aorta extending into the proximal left common iliac artery where it terminates. The false lumen of the dissection in the abdominal aorta appears largely thrombosed or has slow flow with a fenestration in the left common iliac artery which supplies the more distal aspect of the false lumen. This again remains similar in morphology to previous exam. The infrarenal abdominal aorta remains borderline aneurysmal up to 3 cm, again stable. Celiac: Patent and arising from the true lumen. No significant stenosis. SMA: Patent and arising from the true lumen. No significant stenosis. IMA: Patent and arising from the true lumen. No significant stenosis Renals: The bilateral main renal arteries arise from the true lumen and are patent. Calcific atherosclerosis results in mild to moderate ostial stenosis of the right renal artery. There are additional bilateral accessory renal arteries which appear to arise from the false lumen but remain patent. Inflow: The dissection flap extends into the proximal left common iliac artery. The right common iliac artery does not appear involved. The iliac and visualized femoral arteries are patent without significant stenosis. Veins: No obvious venous abnormality within the limitations of this arterial phase study. NON-VASCULAR Hepatobiliary: The liver is normal in size without focal abnormality. No intrahepatic or extrahepatic biliary ductal dilation. The gallbladder appears normal. Spleen: Normal in size without focal abnormality. Pancreas: No pancreatic ductal dilatation or  surrounding inflammatory changes. Adrenals/Urinary Tract: Adrenal glands are unremarkable. The kidneys are normal in size without hydronephrosis. Small right renal cyst. Bladder is unremarkable. Stomach/Bowel: The stomach, small bowel and large bowel are normal in caliber without abnormal wall thickening or surrounding inflammatory changes. There is a large right abdominal sidewall hernia containing loops of small and large bowel without evidence of incarceration or strangulation. Reproductive: Prostate is unremarkable. Lymphatic: No enlarged lymph nodes in the abdomen or pelvis. Other: No abdominopelvic ascites. Musculoskeletal: Suspected nondisplaced mid sternal fracture again seen. Degenerative changes  of the upper lumbar spine. The soft tissues are unremarkable. Review of the MIP images confirms the above findings. IMPRESSION: 1. Overall stable appearance of uncomplicated type B aortic dissection extending from just distal to the left subclavian artery origin to the proximal left common iliac artery. Stable aneurysmal dilation of the distal descending thoracic aorta up to 5 cm, and borderline aneurysmal dilation of the infrarenal abdominal aorta up to 3 cm. No imaging findings of malperfusion or end organ ischemia. 2. Suspected nondisplaced mid sternal fracture again seen. Electronically Signed   By: Albin Felling M.D.   On: 02/16/2021 12:58    Cardiac Studies   Echo 02/17/21   1. Left ventricular ejection fraction, by estimation, is 60 to 65%. The  left ventricle has normal function. Left ventricular endocardial border  not optimally defined to evaluate regional wall motion. Left ventricular  diastolic parameters are consistent  with Grade I diastolic dysfunction (impaired relaxation).   2. Right ventricular systolic function is normal. The right ventricular  size is normal. Tricuspid regurgitation signal is inadequate for assessing  PA pressure.   3. The mitral valve was not well visualized. No  evidence of mitral valve  regurgitation. No evidence of mitral stenosis.   4. The aortic valve was not well visualized. Aortic valve regurgitation  is mild to moderate. No aortic stenosis is present.   5. The inferior vena cava is dilated in size with <50% respiratory  variability, suggesting right atrial pressure of 15 mmHg.   CT Chest with Contrast 02/14/21 IMPRESSION: 1. Type B aortic dissection beginning at the level of the distal aortic arch in ending in the left common iliac artery. Descending thoracic aortic aneurysm measuring up to 5.1 cm. Abdominal aortic aneurysm measuring 3 cm. 2. Findings suspicious for nondisplaced sternal fracture. No mediastinal hematoma. 3. No other acute localizing process in the abdomen or pelvis. 4. Stable large right abdominal wall hernia containing nondilated bowel.  Patient Profile     81 y.o. male male with a history of hypertension, chronic pain, and depression who is being seen on 02/16/2021 for evaluation of hypertension in setting of aortic dissection at the request of Dr. Carlis Abbott.    Assessment & Plan    HTN: In the setting of type B aortic dissection  - Patient was evaluated by vascular surgery who requested cardiology's assistance with aggressive BP and HR control  - IV esmolol discontinued on 1/2 and IV clevidipine was discontinued on 12/31. Patient is not on any IV blood pressure medications at this time - Continue hydralazine 100mg  tablet TID, metoprolol tartrate 25 mg TID. BP and HR have been well controlled on this regiment. Given language barrier, compliance may be an issue with TID dosing of both medications. Patient was previously on metoprolol succinate 25 mg daily at home.   - Echo ordered to assess EF and aortic valve. Completed on 02/17/21 showed LVEF 60-65%,  mild to moderate aortic regurgitation. The aortic valve was not well visualized - Will discuss with MD further plan, may want a TEE to better assess aortic valve   Type B  Aortic Dissection: CT showed type B aortic dissection beginning at the level of he distal aortic arch and ending in the left common iliac artery. Management per Vascular Surgery. - Stable by f/u CTA on 02/16/21   Aortic Regurgitaiton  - Mild-moderate on echo 02/17/21, no previous echos to compare to - Likely will be treated conservatively    - Can be addressed at outpatient follow  up   Sternal Fracture -Treating conservatively with pain control and pulmonary toilet.  Right Upper extremity swelling  - Per vascular note, Right arm IV infiltrated  - Managed per primary team    Tobacco Abuse - Patient reports smoking "a little." Discussed importance of complete cessation   For questions or updates, please contact [facility] Please consult [email or web address] for contact info under        Signed, Margie Billet, PA-C  02/18/2021, 9:05 AM
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
## 2021-02-18 NOTE — Progress Notes (Signed)
Trauma Response Nurse Note-  Reason for Call / Reason for Trauma activation:   - Swelling and blisters to the patients right arm.  Initial Focused Assessment (If applicable, or please see trauma documentation):  - Patients right arm noted to be significantly swollen with blisters. +2 radial pulse noted at the wrist  Plan of Care as of this note:  - Provider stated to continue with dressings and the team will evaluate it in the morning.   Event Summary:   - TRN was up on unit and patients primary RN stated a concern due to blisters on patients right arm. Pt noted to have swelling and blisters to the right arm, with the biggest blister approximately the size of a golf ball. Pt has had dressings on it with xeroform and gauze. Dr. Thermon Leyland notified and stated to continue with the dressings and with team will evaluate it in the morning. Primary RN notified.   The Following (if applicable):    -MD notified: Dr. Thermon Leyland notified at [phone]

## 2021-02-18 NOTE — Care Management Important Message (Signed)
Important Message  Patient Details  Name: Gael Londo MRN: [id number] Date of Birth: Sep 15, 1940   Medicare Important Message Given:  Yes     Shelda Altes 02/18/2021, 11:24 AM

## 2021-02-19 ENCOUNTER — Other Ambulatory Visit (HOSPITAL_COMMUNITY): Payer: Self-pay

## 2021-02-19 DIAGNOSIS — I10 Essential (primary) hypertension: Secondary | ICD-10-CM | POA: Diagnosis not present

## 2021-02-19 DIAGNOSIS — I7103 Dissection of thoracoabdominal aorta: Secondary | ICD-10-CM | POA: Diagnosis not present

## 2021-02-19 LAB — TRIGLYCERIDES: Triglycerides: 292 mg/dL — ABNORMAL HIGH (ref <150)

## 2021-02-19 MED ORDER — POLYETHYLENE GLYCOL 3350 17 G PO PACK
17.0000 g | PACK | Freq: Two times a day (BID) | ORAL | Status: DC
  Administered 2021-02-19: 17 g via ORAL
  Filled 2021-02-19: qty 1

## 2021-02-19 MED ORDER — ACETAMINOPHEN 500 MG PO TABS: 1000.0000 mg | ORAL_TABLET | Freq: Three times a day (TID) | ORAL | 0 refills | Status: AC | PRN

## 2021-02-19 MED ORDER — VALSARTAN 40 MG PO TABS
40.0000 mg | ORAL_TABLET | Freq: Every day | ORAL | 1 refills | Status: AC
  Filled 2021-02-19: qty 30, 30d supply, fill #0

## 2021-02-19 MED ORDER — HYDRALAZINE HCL 25 MG PO TABS
25.0000 mg | ORAL_TABLET | Freq: Two times a day (BID) | ORAL | 1 refills | Status: AC
  Filled 2021-02-19: qty 60, 30d supply, fill #0

## 2021-02-19 MED ORDER — CARVEDILOL 12.5 MG PO TABS
12.5000 mg | ORAL_TABLET | Freq: Two times a day (BID) | ORAL | 1 refills | Status: AC
Start: 2021-02-19 — End: ?
  Filled 2021-02-19: qty 60, 30d supply, fill #0

## 2021-02-19 NOTE — Discharge Instructions (Addendum)
Cuidado de las heridas para el brazo derecho. Al descargar porfavor aconseje al paciente de limpiar el brazo con jabon y [place], seque y luego aplique Xeroform gauze sobre la herida del brazo derecho y luego envuelva la herida con Kerlix. Porfavor cambie los vendajes diario.    Wound Care instructions for Right Arm. At discharge advise patient to clean gently with soap and water, pat dry then apply Xeroform gauze over the wound on the Right Arm then wrap with Kerlix. Change daily.
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
## 2021-02-19 NOTE — Consult Note (Signed)
[place] Nurse Consult Note: Patient receiving care in [place] Reason for Consult: RFA IV infiltration Wound type: IV infiltration site with serous blisters and edema. Pressure Injury POA: NA Patient had already been assessed by Roxy Horseman, PA and the RFA was wrapped with Xeroform and Kerlix. WOC will sign off but are available to this patient and medical team if needed.  Monitor the wound area(s) for worsening of condition such as: Signs/symptoms of infection, increase in size, development of or worsening of odor, development of pain, or increased pain at the affected locations.   Notify the medical team if any of these develop.  Thank you for the consult. [place] nurse will not follow at this time.   Please re-consult the [place] team if needed.  Cathlean Marseilles Tamala Julian, MSN, RN, [place], Lysle Pearl, [facility] Wound Treatment Associate Pager [phone]
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
## 2021-02-19 NOTE — Progress Notes (Signed)
Pt was discharged from 4E to home with wife. Discharge information including medication and dressing change education provided to pt and family via interpretor. Pt and family expressed understanding. IV and telemetry removed.  Raelyn Number, RN

## 2021-02-26 DIAGNOSIS — Z9181 History of falling: Secondary | ICD-10-CM | POA: Diagnosis not present

## 2021-02-26 DIAGNOSIS — I7100 Dissection of unspecified site of aorta: Secondary | ICD-10-CM | POA: Diagnosis not present

## 2021-02-26 DIAGNOSIS — Z6834 Body mass index (BMI) 34.0-34.9, adult: Secondary | ICD-10-CM | POA: Diagnosis not present

## 2021-02-26 DIAGNOSIS — T148XXA Other injury of unspecified body region, initial encounter: Secondary | ICD-10-CM | POA: Diagnosis not present

## 2021-02-26 DIAGNOSIS — S2220XA Unspecified fracture of sternum, initial encounter for closed fracture: Secondary | ICD-10-CM | POA: Diagnosis not present

## 2021-02-26 DIAGNOSIS — I10 Essential (primary) hypertension: Secondary | ICD-10-CM | POA: Diagnosis not present

## 2021-02-28 ENCOUNTER — Other Ambulatory Visit: Payer: Self-pay

## 2021-02-28 DIAGNOSIS — I7103 Dissection of thoracoabdominal aorta: Secondary | ICD-10-CM

## 2021-03-07 DIAGNOSIS — M170 Bilateral primary osteoarthritis of knee: Secondary | ICD-10-CM | POA: Diagnosis not present

## 2021-03-07 DIAGNOSIS — R109 Unspecified abdominal pain: Secondary | ICD-10-CM | POA: Diagnosis not present

## 2021-03-07 DIAGNOSIS — Z1389 Encounter for screening for other disorder: Secondary | ICD-10-CM | POA: Diagnosis not present

## 2021-03-07 DIAGNOSIS — G894 Chronic pain syndrome: Secondary | ICD-10-CM | POA: Diagnosis not present

## 2021-03-07 DIAGNOSIS — Z79891 Long term (current) use of opiate analgesic: Secondary | ICD-10-CM | POA: Diagnosis not present

## 2021-03-07 DIAGNOSIS — K439 Ventral hernia without obstruction or gangrene: Secondary | ICD-10-CM | POA: Diagnosis not present

## 2021-03-12 DIAGNOSIS — Z6833 Body mass index (BMI) 33.0-33.9, adult: Secondary | ICD-10-CM | POA: Diagnosis not present

## 2021-03-12 DIAGNOSIS — Z23 Encounter for immunization: Secondary | ICD-10-CM | POA: Diagnosis not present

## 2021-03-12 DIAGNOSIS — L03113 Cellulitis of right upper limb: Secondary | ICD-10-CM | POA: Diagnosis not present

## 2021-03-12 DIAGNOSIS — E039 Hypothyroidism, unspecified: Secondary | ICD-10-CM | POA: Diagnosis not present

## 2021-03-12 DIAGNOSIS — I10 Essential (primary) hypertension: Secondary | ICD-10-CM | POA: Diagnosis not present

## 2021-03-24 ENCOUNTER — Other Ambulatory Visit: Payer: Medicare Other

## 2021-03-24 ENCOUNTER — Ambulatory Visit
Admission: RE | Admit: 2021-03-24 | Discharge: 2021-03-24 | Disposition: A | Discharge status: Home / Self Care | Payer: Medicare Other | Source: Ambulatory Visit | Attending: Vascular Surgery | Admitting: Vascular Surgery

## 2021-03-24 DIAGNOSIS — I70201 Unspecified atherosclerosis of native arteries of extremities, right leg: Secondary | ICD-10-CM | POA: Diagnosis not present

## 2021-03-24 DIAGNOSIS — I7103 Dissection of thoracoabdominal aorta: Secondary | ICD-10-CM

## 2021-03-24 DIAGNOSIS — I712 Thoracic aortic aneurysm, without rupture, unspecified: Secondary | ICD-10-CM | POA: Diagnosis not present

## 2021-03-24 MED ORDER — IOPAMIDOL (ISOVUE-370) INJECTION 76%
75.0000 mL | Freq: Once | INTRAVENOUS | Status: AC | PRN
  Administered 2021-03-24: 75 mL via INTRAVENOUS

## 2021-03-25 ENCOUNTER — Other Ambulatory Visit: Payer: Self-pay

## 2021-03-25 ENCOUNTER — Ambulatory Visit (INDEPENDENT_AMBULATORY_CARE_PROVIDER_SITE_OTHER): Payer: Medicare Other | Admitting: Vascular Surgery

## 2021-03-25 ENCOUNTER — Encounter: Payer: Self-pay | Admitting: Vascular Surgery

## 2021-03-25 VITALS — BP 151/86 | HR 69 | Temp 97.5°F | Resp 18 | Ht 65.0 in | Wt 200.0 lb

## 2021-03-25 DIAGNOSIS — I712 Thoracic aortic aneurysm, without rupture, unspecified: Secondary | ICD-10-CM | POA: Insufficient documentation

## 2021-03-25 DIAGNOSIS — I7103 Dissection of thoracoabdominal aorta: Secondary | ICD-10-CM
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
## 2021-03-26 ENCOUNTER — Ambulatory Visit: Payer: Medicare Other | Admitting: Physician Assistant

## 2021-03-26 DIAGNOSIS — I7103 Dissection of thoracoabdominal aorta: Secondary | ICD-10-CM

## 2021-03-26 DIAGNOSIS — I10 Essential (primary) hypertension: Secondary | ICD-10-CM

## 2021-03-26 DIAGNOSIS — I7123 Aneurysm of the descending thoracic aorta, without rupture: Secondary | ICD-10-CM

## 2021-04-04 DIAGNOSIS — K439 Ventral hernia without obstruction or gangrene: Secondary | ICD-10-CM | POA: Diagnosis not present

## 2021-04-04 DIAGNOSIS — G894 Chronic pain syndrome: Secondary | ICD-10-CM | POA: Diagnosis not present

## 2021-04-04 DIAGNOSIS — R109 Unspecified abdominal pain: Secondary | ICD-10-CM | POA: Diagnosis not present

## 2021-04-04 DIAGNOSIS — M170 Bilateral primary osteoarthritis of knee: Secondary | ICD-10-CM | POA: Diagnosis not present

## 2021-04-04 DIAGNOSIS — Z1389 Encounter for screening for other disorder: Secondary | ICD-10-CM | POA: Diagnosis not present

## 2021-04-04 DIAGNOSIS — Z79891 Long term (current) use of opiate analgesic: Secondary | ICD-10-CM | POA: Diagnosis not present

## 2021-04-19 DIAGNOSIS — I10 Essential (primary) hypertension: Secondary | ICD-10-CM | POA: Diagnosis not present

## 2021-04-19 DIAGNOSIS — L98499 Non-pressure chronic ulcer of skin of other sites with unspecified severity: Secondary | ICD-10-CM | POA: Diagnosis not present

## 2021-04-19 DIAGNOSIS — M109 Gout, unspecified: Secondary | ICD-10-CM | POA: Diagnosis not present

## 2021-04-19 DIAGNOSIS — Z79899 Other long term (current) drug therapy: Secondary | ICD-10-CM | POA: Diagnosis not present

## 2021-04-19 DIAGNOSIS — S41101A Unspecified open wound of right upper arm, initial encounter: Secondary | ICD-10-CM | POA: Diagnosis not present

## 2021-05-02 DIAGNOSIS — G894 Chronic pain syndrome: Secondary | ICD-10-CM | POA: Diagnosis not present

## 2021-05-02 DIAGNOSIS — R109 Unspecified abdominal pain: Secondary | ICD-10-CM | POA: Diagnosis not present

## 2021-05-02 DIAGNOSIS — M170 Bilateral primary osteoarthritis of knee: Secondary | ICD-10-CM | POA: Diagnosis not present

## 2021-05-02 DIAGNOSIS — Z1389 Encounter for screening for other disorder: Secondary | ICD-10-CM | POA: Diagnosis not present

## 2021-05-02 DIAGNOSIS — K439 Ventral hernia without obstruction or gangrene: Secondary | ICD-10-CM | POA: Diagnosis not present

## 2021-05-02 DIAGNOSIS — Z79891 Long term (current) use of opiate analgesic: Secondary | ICD-10-CM | POA: Diagnosis not present

## 2021-05-06 DIAGNOSIS — S51809A Unspecified open wound of unspecified forearm, initial encounter: Secondary | ICD-10-CM | POA: Diagnosis not present

## 2021-05-06 DIAGNOSIS — Z23 Encounter for immunization: Secondary | ICD-10-CM | POA: Diagnosis not present

## 2021-05-06 DIAGNOSIS — E039 Hypothyroidism, unspecified: Secondary | ICD-10-CM | POA: Diagnosis not present

## 2021-05-06 DIAGNOSIS — M1710 Unilateral primary osteoarthritis, unspecified knee: Secondary | ICD-10-CM | POA: Diagnosis not present

## 2021-05-06 DIAGNOSIS — Z01818 Encounter for other preprocedural examination: Secondary | ICD-10-CM | POA: Diagnosis not present

## 2021-05-06 DIAGNOSIS — Z6833 Body mass index (BMI) 33.0-33.9, adult: Secondary | ICD-10-CM | POA: Diagnosis not present

## 2021-05-06 DIAGNOSIS — I10 Essential (primary) hypertension: Secondary | ICD-10-CM | POA: Diagnosis not present

## 2021-05-09 DIAGNOSIS — S51801A Unspecified open wound of right forearm, initial encounter: Secondary | ICD-10-CM | POA: Diagnosis not present

## 2021-05-09 DIAGNOSIS — T80818A Extravasation of other vesicant agent, initial encounter: Secondary | ICD-10-CM | POA: Diagnosis not present

## 2021-05-09 DIAGNOSIS — L98498 Non-pressure chronic ulcer of skin of other sites with other specified severity: Secondary | ICD-10-CM | POA: Diagnosis not present

## 2021-05-09 DIAGNOSIS — X58XXXA Exposure to other specified factors, initial encounter: Secondary | ICD-10-CM | POA: Diagnosis not present

## 2021-05-14 DIAGNOSIS — L98498 Non-pressure chronic ulcer of skin of other sites with other specified severity: Secondary | ICD-10-CM | POA: Diagnosis not present

## 2021-05-14 DIAGNOSIS — T80818A Extravasation of other vesicant agent, initial encounter: Secondary | ICD-10-CM | POA: Diagnosis not present

## 2021-05-14 DIAGNOSIS — X58XXXA Exposure to other specified factors, initial encounter: Secondary | ICD-10-CM | POA: Diagnosis not present

## 2021-05-14 DIAGNOSIS — S51801A Unspecified open wound of right forearm, initial encounter: Secondary | ICD-10-CM | POA: Diagnosis not present

## 2021-05-20 DIAGNOSIS — S41102A Unspecified open wound of left upper arm, initial encounter: Secondary | ICD-10-CM | POA: Diagnosis not present

## 2021-05-20 DIAGNOSIS — L98498 Non-pressure chronic ulcer of skin of other sites with other specified severity: Secondary | ICD-10-CM | POA: Diagnosis not present

## 2021-05-20 DIAGNOSIS — T80818A Extravasation of other vesicant agent, initial encounter: Secondary | ICD-10-CM | POA: Diagnosis not present

## 2021-05-20 DIAGNOSIS — T8189XD Other complications of procedures, not elsewhere classified, subsequent encounter: Secondary | ICD-10-CM | POA: Diagnosis not present

## 2021-05-22 DIAGNOSIS — S41102A Unspecified open wound of left upper arm, initial encounter: Secondary | ICD-10-CM | POA: Diagnosis not present

## 2021-05-22 DIAGNOSIS — X58XXXA Exposure to other specified factors, initial encounter: Secondary | ICD-10-CM | POA: Diagnosis not present

## 2021-05-29 DIAGNOSIS — S41102A Unspecified open wound of left upper arm, initial encounter: Secondary | ICD-10-CM | POA: Diagnosis not present

## 2021-05-29 DIAGNOSIS — X58XXXA Exposure to other specified factors, initial encounter: Secondary | ICD-10-CM | POA: Diagnosis not present

## 2021-05-30 DIAGNOSIS — M170 Bilateral primary osteoarthritis of knee: Secondary | ICD-10-CM | POA: Diagnosis not present

## 2021-05-30 DIAGNOSIS — K439 Ventral hernia without obstruction or gangrene: Secondary | ICD-10-CM | POA: Diagnosis not present

## 2021-05-30 DIAGNOSIS — Z1389 Encounter for screening for other disorder: Secondary | ICD-10-CM | POA: Diagnosis not present

## 2021-05-30 DIAGNOSIS — G894 Chronic pain syndrome: Secondary | ICD-10-CM | POA: Diagnosis not present

## 2021-05-30 DIAGNOSIS — Z79891 Long term (current) use of opiate analgesic: Secondary | ICD-10-CM | POA: Diagnosis not present

## 2021-05-30 DIAGNOSIS — R109 Unspecified abdominal pain: Secondary | ICD-10-CM | POA: Diagnosis not present

## 2021-06-05 DIAGNOSIS — S41102A Unspecified open wound of left upper arm, initial encounter: Secondary | ICD-10-CM | POA: Diagnosis not present

## 2021-06-05 DIAGNOSIS — X58XXXA Exposure to other specified factors, initial encounter: Secondary | ICD-10-CM | POA: Diagnosis not present

## 2021-06-12 DIAGNOSIS — X58XXXA Exposure to other specified factors, initial encounter: Secondary | ICD-10-CM | POA: Diagnosis not present

## 2021-06-12 DIAGNOSIS — S41102A Unspecified open wound of left upper arm, initial encounter: Secondary | ICD-10-CM | POA: Diagnosis not present

## 2021-06-19 DIAGNOSIS — X58XXXA Exposure to other specified factors, initial encounter: Secondary | ICD-10-CM | POA: Diagnosis not present

## 2021-06-19 DIAGNOSIS — T80818A Extravasation of other vesicant agent, initial encounter: Secondary | ICD-10-CM | POA: Diagnosis not present

## 2021-06-19 DIAGNOSIS — S41101A Unspecified open wound of right upper arm, initial encounter: Secondary | ICD-10-CM | POA: Diagnosis not present

## 2021-06-26 DIAGNOSIS — T80818A Extravasation of other vesicant agent, initial encounter: Secondary | ICD-10-CM | POA: Diagnosis not present

## 2021-06-26 DIAGNOSIS — S41101A Unspecified open wound of right upper arm, initial encounter: Secondary | ICD-10-CM | POA: Diagnosis not present

## 2021-07-03 DIAGNOSIS — T80818A Extravasation of other vesicant agent, initial encounter: Secondary | ICD-10-CM | POA: Diagnosis not present

## 2021-07-03 DIAGNOSIS — S41101A Unspecified open wound of right upper arm, initial encounter: Secondary | ICD-10-CM | POA: Diagnosis not present

## 2021-07-03 DIAGNOSIS — X58XXXA Exposure to other specified factors, initial encounter: Secondary | ICD-10-CM | POA: Diagnosis not present

## 2021-07-08 DIAGNOSIS — R109 Unspecified abdominal pain: Secondary | ICD-10-CM | POA: Diagnosis not present

## 2021-07-08 DIAGNOSIS — Z1389 Encounter for screening for other disorder: Secondary | ICD-10-CM | POA: Diagnosis not present

## 2021-07-08 DIAGNOSIS — Z79891 Long term (current) use of opiate analgesic: Secondary | ICD-10-CM | POA: Diagnosis not present

## 2021-07-08 DIAGNOSIS — M170 Bilateral primary osteoarthritis of knee: Secondary | ICD-10-CM | POA: Diagnosis not present

## 2021-07-08 DIAGNOSIS — K439 Ventral hernia without obstruction or gangrene: Secondary | ICD-10-CM | POA: Diagnosis not present

## 2021-07-08 DIAGNOSIS — G894 Chronic pain syndrome: Secondary | ICD-10-CM | POA: Diagnosis not present

## 2021-07-10 DIAGNOSIS — S41101A Unspecified open wound of right upper arm, initial encounter: Secondary | ICD-10-CM | POA: Diagnosis not present

## 2021-07-10 DIAGNOSIS — X58XXXA Exposure to other specified factors, initial encounter: Secondary | ICD-10-CM | POA: Diagnosis not present

## 2021-07-10 DIAGNOSIS — T80818A Extravasation of other vesicant agent, initial encounter: Secondary | ICD-10-CM | POA: Diagnosis not present

## 2021-07-17 DIAGNOSIS — S41101A Unspecified open wound of right upper arm, initial encounter: Secondary | ICD-10-CM | POA: Diagnosis not present

## 2021-07-17 DIAGNOSIS — X58XXXA Exposure to other specified factors, initial encounter: Secondary | ICD-10-CM | POA: Diagnosis not present

## 2021-08-08 DIAGNOSIS — R109 Unspecified abdominal pain: Secondary | ICD-10-CM | POA: Diagnosis not present

## 2021-08-08 DIAGNOSIS — G894 Chronic pain syndrome: Secondary | ICD-10-CM | POA: Diagnosis not present

## 2021-08-08 DIAGNOSIS — Z79891 Long term (current) use of opiate analgesic: Secondary | ICD-10-CM | POA: Diagnosis not present

## 2021-08-08 DIAGNOSIS — K439 Ventral hernia without obstruction or gangrene: Secondary | ICD-10-CM | POA: Diagnosis not present

## 2021-08-08 DIAGNOSIS — Z1389 Encounter for screening for other disorder: Secondary | ICD-10-CM | POA: Diagnosis not present

## 2021-08-08 DIAGNOSIS — M170 Bilateral primary osteoarthritis of knee: Secondary | ICD-10-CM | POA: Diagnosis not present

## 2021-11-26 ENCOUNTER — Telehealth: Payer: Self-pay

## 2021-11-26 NOTE — Patient Outreach (Signed)
  Care Coordination   11/26/2021 Name: Julian Parker MRN: [id number] DOB: 1940-03-16   Care Coordination Outreach Attempts:  An unsuccessful telephone outreach was attempted today to offer the patient information about available care coordination services as a benefit of their health plan.   Follow Up Plan:  Additional outreach attempts will be made to offer the patient care coordination information and services.   Encounter Outcome:  No Answer  Care Coordination Interventions Activated:  No   Care Coordination Interventions:  No, not indicated    Tomasa Rand, RN, BSN, CEN [facility] [employer] [phone]

## 2021-12-01 ENCOUNTER — Telehealth: Payer: Self-pay

## 2021-12-01 NOTE — Patient Outreach (Signed)
  Care Coordination   12/01/2021 Name: Julian Parker MRN: [id number] DOB: 1940-10-26   Care Coordination Outreach Attempts:  A second unsuccessful outreach was attempted today to offer the patient with information about available care coordination services as a benefit of their health plan.     Follow Up Plan:  Additional outreach attempts will be made to offer the patient care coordination information and services.   Encounter Outcome:  No Answer  Care Coordination Interventions Activated:  No   Care Coordination Interventions:  No, not indicated    Tomasa Rand, RN, BSN, CEN [facility] [employer] [phone]

## 2021-12-15 ENCOUNTER — Telehealth: Payer: Self-pay

## 2021-12-15 NOTE — Patient Outreach (Signed)
  Care Coordination   12/15/2021 Name: Julian Parker MRN: [id number] DOB: [birth year]   Care Coordination Outreach Attempts:  A third unsuccessful outreach was attempted today to offer the patient with information about available care coordination services as a benefit of their health plan.   Follow Up Plan:  No further outreach attempts will be made at this time. We have been unable to contact the patient to offer or enroll patient in care coordination services  Encounter Outcome:  No Answer  Care Coordination Interventions Activated:  No   Care Coordination Interventions:  No, not indicated    Tomasa Rand, RN, BSN, CEN [place] Coordinator [phone]

## 2022-01-13 DIAGNOSIS — K5909 Other constipation: Secondary | ICD-10-CM | POA: Diagnosis not present

## 2022-01-13 DIAGNOSIS — R109 Unspecified abdominal pain: Secondary | ICD-10-CM | POA: Diagnosis not present

## 2022-01-13 DIAGNOSIS — F32A Depression, unspecified: Secondary | ICD-10-CM | POA: Diagnosis not present

## 2022-01-13 DIAGNOSIS — E039 Hypothyroidism, unspecified: Secondary | ICD-10-CM | POA: Diagnosis not present

## 2022-01-13 DIAGNOSIS — I10 Essential (primary) hypertension: Secondary | ICD-10-CM | POA: Diagnosis not present

## 2022-01-13 DIAGNOSIS — Z6831 Body mass index (BMI) 31.0-31.9, adult: Secondary | ICD-10-CM | POA: Diagnosis not present

## 2022-01-13 DIAGNOSIS — Z139 Encounter for screening, unspecified: Secondary | ICD-10-CM | POA: Diagnosis not present

## 2022-01-13 DIAGNOSIS — M1710 Unilateral primary osteoarthritis, unspecified knee: Secondary | ICD-10-CM | POA: Diagnosis not present

## 2022-01-13 DIAGNOSIS — M109 Gout, unspecified: Secondary | ICD-10-CM | POA: Diagnosis not present

## 2022-01-13 DIAGNOSIS — Z23 Encounter for immunization: Secondary | ICD-10-CM | POA: Diagnosis not present

## 2022-01-13 DIAGNOSIS — Z79899 Other long term (current) drug therapy: Secondary | ICD-10-CM | POA: Diagnosis not present
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
